# Patient Record
Sex: Male | Born: 1950 | Race: White | Hispanic: No | Marital: Married | Smoking: Former smoker
Health system: Southern US, Community
[De-identification: ages and names within clinical notes are randomized; demographics above are authoritative.]

## PROBLEM LIST (undated history)

## (undated) DIAGNOSIS — M1990 Unspecified osteoarthritis, unspecified site: Secondary | ICD-10-CM

## (undated) DIAGNOSIS — I10 Essential (primary) hypertension: Secondary | ICD-10-CM

## (undated) DIAGNOSIS — G459 Transient cerebral ischemic attack, unspecified: Secondary | ICD-10-CM

## (undated) DIAGNOSIS — H3530 Unspecified macular degeneration: Secondary | ICD-10-CM

## (undated) DIAGNOSIS — E785 Hyperlipidemia, unspecified: Secondary | ICD-10-CM

## (undated) DIAGNOSIS — I639 Cerebral infarction, unspecified: Secondary | ICD-10-CM

## (undated) DIAGNOSIS — G4730 Sleep apnea, unspecified: Secondary | ICD-10-CM

## (undated) DIAGNOSIS — K219 Gastro-esophageal reflux disease without esophagitis: Secondary | ICD-10-CM

## (undated) DIAGNOSIS — M75100 Unspecified rotator cuff tear or rupture of unspecified shoulder, not specified as traumatic: Secondary | ICD-10-CM

## (undated) HISTORY — PX: TONSILLECTOMY: SUR1361

## (undated) HISTORY — DX: Hyperlipidemia, unspecified: E78.5

## (undated) HISTORY — PX: MOHS SURGERY: SUR867

## (undated) HISTORY — PX: OTHER SURGICAL HISTORY: SHX169

## (undated) HISTORY — PX: KNEE ARTHROSCOPY: SHX127

## (undated) HISTORY — PX: COLONOSCOPY: SHX174

---

## 1968-10-27 HISTORY — PX: BACK SURGERY: SHX140

## 2006-01-03 ENCOUNTER — Emergency Department: Payer: Self-pay | Admitting: Emergency Medicine

## 2009-01-03 ENCOUNTER — Ambulatory Visit: Payer: Self-pay

## 2009-11-05 ENCOUNTER — Encounter (INDEPENDENT_AMBULATORY_CARE_PROVIDER_SITE_OTHER): Payer: Self-pay | Admitting: *Deleted

## 2009-11-12 ENCOUNTER — Ambulatory Visit: Payer: Self-pay | Admitting: Internal Medicine

## 2009-11-20 ENCOUNTER — Ambulatory Visit: Payer: Self-pay | Admitting: Internal Medicine

## 2009-11-21 ENCOUNTER — Encounter: Payer: Self-pay | Admitting: Internal Medicine

## 2010-01-18 ENCOUNTER — Ambulatory Visit: Payer: Self-pay

## 2010-02-21 ENCOUNTER — Ambulatory Visit: Payer: Self-pay | Admitting: Orthopedic Surgery

## 2010-02-27 ENCOUNTER — Ambulatory Visit: Payer: Self-pay | Admitting: Orthopedic Surgery

## 2010-03-01 ENCOUNTER — Ambulatory Visit: Payer: Self-pay | Admitting: Orthopedic Surgery

## 2010-11-28 NOTE — Procedures (Signed)
Summary: Colonoscopy  Patient: Adam Villa Note: All result statuses are Final unless otherwise noted.  Tests: (1) Colonoscopy (COL)   COL Colonoscopy           DONE     [facility]     [address]     [place], [place]  [address]           COLONOSCOPY PROCEDURE REPORT           PATIENT:  Adam, Villa  MR#:  [id number]     BIRTHDATE:  Sep 11, 1951, 58 yrs. old  GENDER:  male           ENDOSCOPIST:  Wilhemina Bonito. Eda Keys, MD     Referred by:  Horton Chin, M.D.           PROCEDURE DATE:  11/20/2009     PROCEDURE:  Colonoscopy with snare polypectomy     ASA CLASS:  Class I     INDICATIONS:  history of polyps (hyperplastic on index exam     12-2001)           MEDICATIONS:   Fentanyl 50 mcg IV, Versed 6 mg IV           DESCRIPTION OF PROCEDURE:   After the risks benefits and     alternatives of the procedure were thoroughly explained, informed     consent was obtained.  Digital rectal exam was performed and     revealed no abnormalities.   The LB CF-H180AL [id number] endoscope     was introduced through the anus and advanced to the cecum, which     was identified by both the appendix and ileocecal valve, without     limitations. Time to cecum = 2:37 min.The quality of the prep was     excellent, using MoviPrep.  The instrument was then withdrawn     (time = 13:03 min) as the colon was fully examined.     <<PROCEDUREIMAGES>>           FINDINGS:  A 3mm diminutive polyp was found in the descending     colon. Polyp was snared without cautery. Retrieval was successful.     snare polyp  This was otherwise a normal examination of the colon.     Retroflexed views in the rectum revealed internal hemorrhoids.     The scope was then withdrawn from the patient and the procedure     completed.           COMPLICATIONS:  None           ENDOSCOPIC IMPRESSION:     1) Diminutive polyp in the descending colon -removed     2) Otherwise normal examination     3) Internal hemorrhoids       RECOMMENDATIONS:     1) Repeat colonoscopy in 5 years if polyp adenomatous; otherwise     10 years           ______________________________     Wilhemina Bonito. Eda Keys, MD           CC:  Horton Chin, MD; The Patient           n.     eSIGNED:   Wilhemina Bonito. Eda Keys at 11/20/2009 08:50 AM           Ralene Bathe, [id number]  Note: An exclamation mark (!) indicates a result that was not dispersed into the flowsheet. Document Creation Date: 11/20/2009 8:51 AM _______________________________________________________________________  Marland Kitchen  1) Order result status: Final Collection or observation date-time: 11/20/2009 08:38 Requested date-time:  Receipt date-time:  Reported date-time:  Referring Physician:   Ordering Physician: Fransico Setters [id number]) Specimen Source:  Source: Launa Grill Order Number: [id number] Lab site:   Appended Document: Colonoscopy     Procedures Next Due Date:    Colonoscopy: 11/2019

## 2010-11-28 NOTE — Miscellaneous (Signed)
Summary: [facility]  Clinical Lists Changes  Medications: Added new medication of MOVIPREP 100 GM  SOLR (PEG-KCL-NACL-NASULF-NA ASC-C) As per prep instructions. - Signed Rx of MOVIPREP 100 GM  SOLR (PEG-KCL-NACL-NASULF-NA ASC-C) As per prep instructions.;  #1 x 0;  Signed;  Entered by: Ezra Sites RN;  Authorized by: Hilarie Fredrickson MD;  Method used: Electronically to [id number] [address], [place], [place]  [address], Ph: [phone] or [phone], Fax: [phone] Allergies: Added new allergy or adverse reaction of PENICILLIN Observations: Added new observation of NKA: F (11/12/2009 15:23)    Prescriptions: MOVIPREP 100 GM  SOLR (PEG-KCL-NACL-NASULF-NA ASC-C) As per prep instructions.  #1 x 0   Entered by:   Ezra Sites RN   Authorized by:   Hilarie Fredrickson MD   Signed by:   Ezra Sites RN on 11/12/2009   Method used:   Electronically to        [id number] (retail)       [address]       [place], [place]  [address]       Ph: [phone] or [phone]       Fax: [phone]   RxID:   [id number]

## 2010-11-28 NOTE — Letter (Signed)
Summary: [facility] Instructions  [facility]  [address] [place], [place] [address]   Phone: [phone]  Fax: [phone]       Adam Villa    1951/01/03    MRN: [id number]        Procedure Day /Date:  Tuesday 11/20/2009     Arrival Time:  7:30 am      Procedure Time:  8:00 am     Location of Procedure:                    _ x_  [facility] (4th Floor)   PREPARATION FOR COLONOSCOPY WITH MOVIPREP   Starting 5 days prior to your procedure Thursday 1/20 do not eat nuts, seeds, popcorn, corn, beans, peas,  salads, or any raw vegetables.  Do not take any fiber supplements (e.g. Metamucil, Citrucel, and Benefiber).  THE DAY BEFORE YOUR PROCEDURE         DATE: Monday 1/24  1.  Drink clear liquids the entire day-NO SOLID FOOD  2.  Do not drink anything colored red or purple.  Avoid juices with pulp.  No orange juice.  3.  Drink at least 64 oz. (8 glasses) of fluid/clear liquids during the day to prevent dehydration and help the prep work efficiently.  CLEAR LIQUIDS INCLUDE: Water Jello Ice Popsicles Tea (sugar ok, no milk/cream) Powdered fruit flavored drinks Coffee (sugar ok, no milk/cream) Gatorade Juice: apple, white grape, white cranberry  Lemonade Clear bullion, consomm, broth Carbonated beverages (any kind) Strained chicken noodle soup Hard Candy                             4.  In the morning, mix first dose of MoviPrep solution:    Empty 1 Pouch A and 1 Pouch B into the disposable container    Add lukewarm drinking water to the top line of the container. Mix to dissolve    Refrigerate (mixed solution should be used within 24 hrs)  5.  Begin drinking the prep at 5:00 p.m. The MoviPrep container is divided by 4 marks.   Every 15 minutes drink the solution down to the next mark (approximately 8 oz) until the full liter is complete.   6.  Follow completed prep with 16 oz of clear liquid of your choice (Nothing red or purple).   Continue to drink clear liquids until bedtime.  7.  Before going to bed, mix second dose of MoviPrep solution:    Empty 1 Pouch A and 1 Pouch B into the disposable container    Add lukewarm drinking water to the top line of the container. Mix to dissolve    Refrigerate  THE DAY OF YOUR PROCEDURE      DATE: Tuesday 1/15  Beginning at 3:00 a.m. (5 hours before procedure):         1. Every 15 minutes, drink the solution down to the next mark (approx 8 oz) until the full liter is complete.  2. Follow completed prep with 16 oz. of clear liquid of your choice.    3. You may drink clear liquids until 6:00 am (2 HOURS BEFORE PROCEDURE).   MEDICATION INSTRUCTIONS  Unless otherwise instructed, you should take regular prescription medications with a small sip of water   as early as possible the morning of your procedure.         OTHER INSTRUCTIONS  You will need a responsible adult  at least 62 years of age to accompany you and drive you home.   This person must remain in the waiting room during your procedure.  Wear loose fitting clothing that is easily removed.  Leave jewelry and other valuables at home.  However, you may wish to bring a book to read or  an iPod/MP3 player to listen to music as you wait for your procedure to start.  Remove all body piercing jewelry and leave at home.  Total time from sign-in until discharge is approximately 2-3 hours.  You should go home directly after your procedure and rest.  You can resume normal activities the  day after your procedure.  The day of your procedure you should not:   Drive   Make legal decisions   Operate machinery   Drink alcohol   Return to work  You will receive specific instructions about eating, activities and medications before you leave.    The above instructions have been reviewed and explained to me by  Ezra Sites RN  November 12, 2009 3:43 PM      I fully understand and can verbalize these  instructions _____________________________ Date _________

## 2010-11-28 NOTE — Letter (Signed)
Summary: Patient Notice- Polyp Results  [facility]  [address] [place], [place] [address]   Phone: [phone]  Fax: [phone]        November 21, 2009 MRN: [id number]    [facility] [address] [place], [place]  [address]    Dear Mr. TIPPEN,  I am pleased to inform you that the colon polyp(s) removed during your recent colonoscopy was (were) found to be benign (no cancer detected) upon pathologic examination.  I recommend you have a repeat colonoscopy examination in 10 years to look for recurrent polyps, as having colon polyps increases your risk for having recurrent polyps or even colon cancer in the future.  Should you develop new or worsening symptoms of abdominal pain, bowel habit changes or bleeding from the rectum or bowels, please schedule an evaluation with either your primary care physician or with me.  Additional information/recommendations:  __ No further action with gastroenterology is needed at this time. Please      follow-up with your primary care physician for your other healthcare      needs.  Please call us if you are having persistent problems or have questions about your condition that have not been fully answered at this time.  Sincerely,  Hilarie Fredrickson MD  This letter has been electronically signed by your physician.  Appended Document: Patient Notice- Polyp Results Letter mailed 1.28.11

## 2013-02-24 ENCOUNTER — Ambulatory Visit: Payer: Self-pay | Admitting: Internal Medicine

## 2013-07-12 ENCOUNTER — Ambulatory Visit: Payer: Self-pay | Admitting: Internal Medicine

## 2015-06-19 ENCOUNTER — Other Ambulatory Visit: Payer: Self-pay | Admitting: Orthopedic Surgery

## 2015-06-19 DIAGNOSIS — M75102 Unspecified rotator cuff tear or rupture of left shoulder, not specified as traumatic: Secondary | ICD-10-CM

## 2015-06-21 ENCOUNTER — Encounter: Payer: Self-pay | Admitting: Internal Medicine

## 2015-06-25 ENCOUNTER — Ambulatory Visit
Admission: RE | Admit: 2015-06-25 | Discharge: 2015-06-25 | Disposition: A | Discharge status: Home / Self Care | Payer: Managed Care, Other (non HMO) | Source: Ambulatory Visit | Attending: Orthopedic Surgery | Admitting: Orthopedic Surgery

## 2015-06-25 DIAGNOSIS — M19012 Primary osteoarthritis, left shoulder: Secondary | ICD-10-CM | POA: Diagnosis not present

## 2015-06-25 DIAGNOSIS — M25412 Effusion, left shoulder: Secondary | ICD-10-CM | POA: Diagnosis not present

## 2015-06-25 DIAGNOSIS — M7582 Other shoulder lesions, left shoulder: Secondary | ICD-10-CM | POA: Insufficient documentation

## 2015-06-25 DIAGNOSIS — M75102 Unspecified rotator cuff tear or rupture of left shoulder, not specified as traumatic: Secondary | ICD-10-CM

## 2015-06-25 DIAGNOSIS — M25512 Pain in left shoulder: Secondary | ICD-10-CM | POA: Diagnosis present

## 2015-08-22 ENCOUNTER — Encounter
Admission: RE | Admit: 2015-08-22 | Discharge: 2015-08-22 | Disposition: A | Discharge status: Home / Self Care | Payer: Managed Care, Other (non HMO) | Source: Ambulatory Visit | Attending: Surgery | Admitting: Surgery

## 2015-08-22 DIAGNOSIS — M75112 Incomplete rotator cuff tear or rupture of left shoulder, not specified as traumatic: Secondary | ICD-10-CM | POA: Diagnosis not present

## 2015-08-22 DIAGNOSIS — Z01818 Encounter for other preprocedural examination: Secondary | ICD-10-CM | POA: Diagnosis present

## 2015-08-22 HISTORY — DX: Essential (primary) hypertension: I10

## 2015-08-22 HISTORY — DX: Unspecified rotator cuff tear or rupture of unspecified shoulder, not specified as traumatic: M75.100

## 2015-08-22 HISTORY — DX: Hyperlipidemia, unspecified: E78.5

## 2015-08-22 HISTORY — DX: Gastro-esophageal reflux disease without esophagitis: K21.9

## 2015-08-22 HISTORY — DX: Unspecified osteoarthritis, unspecified site: M19.90

## 2015-08-22 NOTE — Patient Instructions (Addendum)
  Your procedure is scheduled on: 08/30/15 Thurs  Report to Day Surgery. To find out your arrival time please call [phone] between 1PM - 3PM on 08/29/15 Wed.  Remember: Instructions that are not followed completely may result in serious medical risk, up to and including death, or upon the discretion of your surgeon and anesthesiologist your surgery may need to be rescheduled.    __x__ 1. Do not eat food or drink liquids after midnight. No gum chewing or hard candies.     __x__ 2. No Alcohol for 24 hours before or after surgery.   ____ 3. Bring all medications with you on the day of surgery if instructed.    __x__ 4. Notify your doctor if there is any change in your medical condition     (cold, fever, infections).     Do not wear jewelry, make-up, hairpins, clips or nail polish.  Do not wear lotions, powders, or perfumes. You may wear deodorant.  Do not shave 48 hours prior to surgery. Men may shave face and neck.  Do not bring valuables to the hospital.    [facility] belongings or valuables.               Contacts, dentures or bridgework may not be worn into surgery.  Leave your suitcase in the car. After surgery it may be brought to your room.  For patients admitted to the hospital, discharge time is determined by your                treatment team.   Patients discharged the day of surgery will not be allowed to drive home.   Please read over the following fact sheets that you were given:      _x___ Take these medicines the morning of surgery with A SIP OF WATER:    1.amLODipine (NORVASC) 10 MG tablet   2. atorvastatin (LIPITOR) 40 MG tablet  3. lansoprazole (PREVACID) 30 MG capsule  4.predniSONE (DELTASONE) 1 MG tablet  5.  6.  ____ Fleet Enema (as directed)   _x___ Use CHG Soap as directed  ____ Use inhalers on the day of surgery  ____ Stop metformin 2 days prior to surgery    ____ Take 1/2 of usual insulin dose the night before  surgery and none on the morning of surgery.   __x__ Stop Coumadin/Plavix/aspirin on Stopped Aspirin 08/21/15  ____ Stop Anti-inflammatories on May take Tylenol as needed   _x___ Stop supplements until after surgery. Stop Fish oils today  __x__ Bring C-Pap to the hospital.

## 2015-08-30 ENCOUNTER — Ambulatory Visit
Admission: RE | Admit: 2015-08-30 | Discharge: 2015-08-30 | Disposition: A | Discharge status: Home / Self Care | Payer: Managed Care, Other (non HMO) | Source: Ambulatory Visit | Attending: Surgery | Admitting: Surgery

## 2015-08-30 ENCOUNTER — Encounter
Admission: RE | Disposition: A | Discharge status: Home / Self Care | Payer: Self-pay | Source: Ambulatory Visit | Attending: Surgery

## 2015-08-30 ENCOUNTER — Ambulatory Visit: Payer: Managed Care, Other (non HMO) | Admitting: Anesthesiology

## 2015-08-30 DIAGNOSIS — Z7982 Long term (current) use of aspirin: Secondary | ICD-10-CM | POA: Insufficient documentation

## 2015-08-30 DIAGNOSIS — Z79899 Other long term (current) drug therapy: Secondary | ICD-10-CM | POA: Insufficient documentation

## 2015-08-30 DIAGNOSIS — M353 Polymyalgia rheumatica: Secondary | ICD-10-CM | POA: Insufficient documentation

## 2015-08-30 DIAGNOSIS — M7522 Bicipital tendinitis, left shoulder: Secondary | ICD-10-CM | POA: Insufficient documentation

## 2015-08-30 DIAGNOSIS — Z87891 Personal history of nicotine dependence: Secondary | ICD-10-CM | POA: Insufficient documentation

## 2015-08-30 DIAGNOSIS — M170 Bilateral primary osteoarthritis of knee: Secondary | ICD-10-CM | POA: Diagnosis not present

## 2015-08-30 DIAGNOSIS — M7542 Impingement syndrome of left shoulder: Secondary | ICD-10-CM | POA: Diagnosis present

## 2015-08-30 DIAGNOSIS — I10 Essential (primary) hypertension: Secondary | ICD-10-CM | POA: Diagnosis not present

## 2015-08-30 DIAGNOSIS — M75102 Unspecified rotator cuff tear or rupture of left shoulder, not specified as traumatic: Secondary | ICD-10-CM | POA: Diagnosis not present

## 2015-08-30 DIAGNOSIS — H9191 Unspecified hearing loss, right ear: Secondary | ICD-10-CM | POA: Insufficient documentation

## 2015-08-30 DIAGNOSIS — Z880 Allergy status to penicillin: Secondary | ICD-10-CM | POA: Diagnosis not present

## 2015-08-30 DIAGNOSIS — Z8673 Personal history of transient ischemic attack (TIA), and cerebral infarction without residual deficits: Secondary | ICD-10-CM | POA: Diagnosis not present

## 2015-08-30 HISTORY — PX: SHOULDER ARTHROSCOPY WITH OPEN ROTATOR CUFF REPAIR: SHX6092

## 2015-08-30 SURGERY — ARTHROSCOPY, SHOULDER WITH REPAIR, ROTATOR CUFF, OPEN
Anesthesia: General | Site: Shoulder | Laterality: Left | Wound class: Clean

## 2015-08-30 MED ORDER — OXYCODONE HCL 5 MG/5ML PO SOLN: 5.0000 mg | Freq: Once | ORAL | Status: DC | PRN

## 2015-08-30 MED ORDER — ONDANSETRON HCL 4 MG/2ML IJ SOLN: 4.0000 mg | Freq: Four times a day (QID) | INTRAMUSCULAR | Status: DC | PRN

## 2015-08-30 MED ORDER — FAMOTIDINE 20 MG PO TABS
ORAL_TABLET | ORAL | Status: AC
  Administered 2015-08-30: 20 mg
  Filled 2015-08-30: qty 1

## 2015-08-30 MED ORDER — METOCLOPRAMIDE HCL 5 MG/ML IJ SOLN: 5.0000 mg | Freq: Three times a day (TID) | INTRAMUSCULAR | Status: DC | PRN

## 2015-08-30 MED ORDER — ROCURONIUM BROMIDE 100 MG/10ML IV SOLN
INTRAVENOUS | Status: DC | PRN
  Administered 2015-08-30: 10 mg via INTRAVENOUS
  Administered 2015-08-30: 30 mg via INTRAVENOUS

## 2015-08-30 MED ORDER — SUGAMMADEX SODIUM 200 MG/2ML IV SOLN
INTRAVENOUS | Status: DC | PRN
  Administered 2015-08-30: 200 mg via INTRAVENOUS

## 2015-08-30 MED ORDER — MIDAZOLAM HCL 5 MG/5ML IJ SOLN
INTRAMUSCULAR | Status: AC
  Administered 2015-08-30: 2 mg
  Filled 2015-08-30: qty 5

## 2015-08-30 MED ORDER — SUCCINYLCHOLINE CHLORIDE 20 MG/ML IJ SOLN
INTRAMUSCULAR | Status: DC | PRN
  Administered 2015-08-30: 100 mg via INTRAVENOUS

## 2015-08-30 MED ORDER — EPINEPHRINE HCL 1 MG/ML IJ SOLN
INTRAMUSCULAR | Status: AC
  Filled 2015-08-30: qty 2

## 2015-08-30 MED ORDER — OXYCODONE HCL 5 MG PO TABS: 5.0000 mg | ORAL_TABLET | ORAL | Status: DC | PRN

## 2015-08-30 MED ORDER — METOCLOPRAMIDE HCL 10 MG PO TABS: 5.0000 mg | ORAL_TABLET | Freq: Three times a day (TID) | ORAL | Status: DC | PRN

## 2015-08-30 MED ORDER — ONDANSETRON HCL 4 MG PO TABS: 4.0000 mg | ORAL_TABLET | Freq: Four times a day (QID) | ORAL | Status: DC | PRN

## 2015-08-30 MED ORDER — MIDAZOLAM HCL 5 MG/5ML IJ SOLN: 1.0000 mg | Freq: Once | INTRAMUSCULAR | Status: DC

## 2015-08-30 MED ORDER — CLINDAMYCIN PHOSPHATE 900 MG/50ML IV SOLN
INTRAVENOUS | Status: AC
  Filled 2015-08-30: qty 50

## 2015-08-30 MED ORDER — FENTANYL CITRATE (PF) 100 MCG/2ML IJ SOLN
INTRAMUSCULAR | Status: DC | PRN
  Administered 2015-08-30: 25 ug via INTRAVENOUS
  Administered 2015-08-30: 75 ug via INTRAVENOUS

## 2015-08-30 MED ORDER — LACTATED RINGERS IV SOLN
INTRAVENOUS | Status: DC
  Administered 2015-08-30 (×3): via INTRAVENOUS

## 2015-08-30 MED ORDER — EPHEDRINE SULFATE 50 MG/ML IJ SOLN
INTRAMUSCULAR | Status: DC | PRN
  Administered 2015-08-30: 10 mg via INTRAVENOUS

## 2015-08-30 MED ORDER — CLINDAMYCIN PHOSPHATE 900 MG/50ML IV SOLN
900.0000 mg | Freq: Once | INTRAVENOUS | Status: AC
  Administered 2015-08-30: 900 mg via INTRAVENOUS

## 2015-08-30 MED ORDER — BUPIVACAINE-EPINEPHRINE 0.5% -1:200000 IJ SOLN
INTRAMUSCULAR | Status: DC | PRN
  Administered 2015-08-30: 30 mL

## 2015-08-30 MED ORDER — PHENYLEPHRINE HCL 10 MG/ML IJ SOLN
INTRAMUSCULAR | Status: DC | PRN
  Administered 2015-08-30 (×3): 100 ug via INTRAVENOUS

## 2015-08-30 MED ORDER — BUPIVACAINE-EPINEPHRINE (PF) 0.5% -1:200000 IJ SOLN
INTRAMUSCULAR | Status: AC
  Filled 2015-08-30: qty 30

## 2015-08-30 MED ORDER — PROPOFOL 10 MG/ML IV BOLUS
INTRAVENOUS | Status: DC | PRN
  Administered 2015-08-30: 160 mg via INTRAVENOUS

## 2015-08-30 MED ORDER — MIDAZOLAM HCL 5 MG/ML IJ SOLN: 1.0000 mg | Freq: Once | INTRAMUSCULAR | Status: DC

## 2015-08-30 MED ORDER — OXYCODONE HCL 5 MG PO TABS: 5.0000 mg | ORAL_TABLET | Freq: Once | ORAL | Status: DC | PRN

## 2015-08-30 MED ORDER — POTASSIUM CHLORIDE IN NACL 20-0.9 MEQ/L-% IV SOLN: INTRAVENOUS | Status: DC

## 2015-08-30 MED ORDER — EPINEPHRINE HCL 1 MG/ML IJ SOLN
INTRAMUSCULAR | Status: DC | PRN
Start: 2015-08-30 — End: 2015-08-30
  Administered 2015-08-30: 2 mL

## 2015-08-30 MED ORDER — ONDANSETRON HCL 4 MG/2ML IJ SOLN
INTRAMUSCULAR | Status: DC | PRN
  Administered 2015-08-30: 4 mg via INTRAVENOUS

## 2015-08-30 MED ORDER — LIDOCAINE HCL (PF) 1 % IJ SOLN
INTRAMUSCULAR | Status: AC
  Administered 2015-08-30: 100 mL
  Administered 2015-08-30: 1 mL via INTRADERMAL
  Filled 2015-08-30: qty 5

## 2015-08-30 MED ORDER — ROPIVACAINE HCL 5 MG/ML IJ SOLN
INTRAMUSCULAR | Status: AC
  Administered 2015-08-30: 10 mL via PERINEURAL
  Administered 2015-08-30 (×2): 5 mL via PERINEURAL
  Filled 2015-08-30: qty 20

## 2015-08-30 MED ORDER — FENTANYL CITRATE (PF) 100 MCG/2ML IJ SOLN: 25.0000 ug | INTRAMUSCULAR | Status: DC | PRN

## 2015-08-30 SURGICAL SUPPLY — 47 items
ANCHOR JUGGERKNOT WTAP NDL 2.9 (Anchor) ×6 IMPLANT
BIT DRILL JUGRKNT W/NDL BIT2.9 (DRILL) ×1 IMPLANT
BLADE FULL RADIUS 3.5 (BLADE) ×3 IMPLANT
BLADE SHAVER 4.5X7 STR FR (MISCELLANEOUS) ×3 IMPLANT
BUR ACROMIONIZER 4.0 (BURR) ×3 IMPLANT
BUR BR 5.5 WIDE MOUTH (BURR) ×3 IMPLANT
CANNULA 8.5X75 THRED (CANNULA) IMPLANT
CANNULA SHAVER 8MMX76MM (CANNULA) ×3 IMPLANT
CHLORAPREP W/TINT 26ML (MISCELLANEOUS) ×3 IMPLANT
COVER MAYO STAND STRL (DRAPES) ×3 IMPLANT
DRAPE IMP U-DRAPE 54X76 (DRAPES) ×3 IMPLANT
DRAPE SURG 17X11 SM STRL (DRAPES) ×3 IMPLANT
DRILL JUGGERKNOT W/NDL BIT 2.9 (DRILL) ×3
DRSG OPSITE POSTOP 4X8 (GAUZE/BANDAGES/DRESSINGS) ×3 IMPLANT
GAUZE PETRO XEROFOAM 1X8 (MISCELLANEOUS) ×3 IMPLANT
GAUZE SPONGE 4X4 12PLY STRL (GAUZE/BANDAGES/DRESSINGS) ×3 IMPLANT
GLOVE BIO SURGEON STRL SZ7.5 (GLOVE) ×6 IMPLANT
GLOVE BIO SURGEON STRL SZ8 (GLOVE) ×6 IMPLANT
GLOVE BIOGEL PI IND STRL 8 (GLOVE) ×1 IMPLANT
GLOVE BIOGEL PI INDICATOR 8 (GLOVE) ×2
GLOVE INDICATOR 8.0 STRL GRN (GLOVE) ×3 IMPLANT
GOWN STRL REUS W/ TWL LRG LVL3 (GOWN DISPOSABLE) ×2 IMPLANT
GOWN STRL REUS W/ TWL XL LVL3 (GOWN DISPOSABLE) IMPLANT
GOWN STRL REUS W/TWL LRG LVL3 (GOWN DISPOSABLE) ×4
GOWN STRL REUS W/TWL XL LVL3 (GOWN DISPOSABLE)
GRASPER SUT 15 45D LOW PRO (SUTURE) IMPLANT
IV LACTATED RINGER IRRG 3000ML (IV SOLUTION) ×4
IV LR IRRIG 3000ML ARTHROMATIC (IV SOLUTION) ×2 IMPLANT
MANIFOLD NEPTUNE II (INSTRUMENTS) ×3 IMPLANT
MASK FACE SPIDER DISP (MASK) ×3 IMPLANT
MAT BLUE FLOOR 46X72 FLO (MISCELLANEOUS) ×3 IMPLANT
NEEDLE REVERSE CUT 1/2 CRC (NEEDLE) IMPLANT
PACK ARTHROSCOPY SHOULDER (MISCELLANEOUS) ×3 IMPLANT
PAD GROUND ADULT SPLIT (MISCELLANEOUS) ×3 IMPLANT
SLING ARM LRG DEEP (SOFTGOODS) ×3 IMPLANT
SLING ULTRA II LG (MISCELLANEOUS) IMPLANT
STAPLER SKIN PROX 35W (STAPLE) ×3 IMPLANT
STRAP SAFETY BODY (MISCELLANEOUS) ×6 IMPLANT
SUT ETHIBOND 0 MO6 C/R (SUTURE) ×3 IMPLANT
SUT PROLENE 4 0 PS 2 18 (SUTURE) IMPLANT
SUT VIC AB 2-0 CT1 27 (SUTURE) ×4
SUT VIC AB 2-0 CT1 TAPERPNT 27 (SUTURE) ×2 IMPLANT
TAPE MICROFOAM 4IN (TAPE) ×3 IMPLANT
TUBING ARTHRO INFLOW-ONLY STRL (TUBING) ×3 IMPLANT
TUBING CONNECTING 10 (TUBING) ×2 IMPLANT
TUBING CONNECTING 10' (TUBING) ×1
WAND HAND CNTRL MULTIVAC 90 (MISCELLANEOUS) ×3 IMPLANT

## 2015-08-30 NOTE — H&P (Signed)
Paper H&P to be scanned into permanent record. H&P reviewed. No changes. 

## 2015-08-30 NOTE — Anesthesia Postprocedure Evaluation (Signed)
  Anesthesia Post-op Note  Patient: Adam Villa  Procedure(s) Performed: Procedure(s): SHOULDER ARTHROSCOPY WITH OPEN ROTATOR CUFF REPAIR and decompression, debridement , biceps tenodesis (Left)  Anesthesia type:General ETT  Patient location: PACU  Post pain: Pain level controlled  Post assessment: Post-op Vital signs reviewed, Patient's Cardiovascular Status Stable, Respiratory Function Stable, Patent Airway and No signs of Nausea or vomiting  Post vital signs: Reviewed and stable  Last Vitals:  Filed Vitals:   08/30/15 1356  BP: 132/74  Pulse: 80  Temp:   Resp: 16    Level of consciousness: awake, alert  and patient cooperative  Complications: No apparent anesthesia complications

## 2015-08-30 NOTE — Op Note (Signed)
08/30/2015  11:57 AM  Patient:   Adam Villa  Pre-Op Diagnosis:   Impingement/tendinopathy with partial-thickness rotator cuff tear, left shoulder.  Postoperative diagnosis: Impingement/tendinopathy with near full-thickness rotator cuff tear and biceps tendinopathy, left shoulder.  Procedure: Extensive arthroscopic debridement, arthroscopic subacromial decompression, mini-open rotator cuff repair, and mini-open biceps tenodesis, left shoulder.  Anesthesia: General endotracheal with interscalene block placed preoperatively by the anesthesiologist.  Surgeon:   Maryagnes Amos, MD  Assistant:   None  Findings: As above. There was moderate labral fraying anteriorly and superiorly without frank detachment of the labrum from the glenoid. The articular surfaces of both the glenoid and humerus were in satisfactory condition. There was extensive thinning with tendinopathic changes of the anterior insertional fibers of the supraspinatus tendon. The remaining portions of the rotator cuff were in satisfactory condition. There also were significant tendinopathic changes with partial tearing of the long head of the biceps tendon.  Complications: None  Fluids:   1000 cc  Estimated blood loss: 5 cc  Tourniquet time: None  Drains: None  Closure: Staples   Brief clinical note: The patient is a 64 year old male with a history of progressively worsening right shoulder pain. The patient's symptoms have progressed despite medications, activity modification, etc. The patient's history and examination are consistent with impingement/tendinopathy with a rotator cuff tear. These findings were confirmed by MRI scan. The patient presents at this time for definitive management of these shoulder symptoms.  Procedure: The patient underwent placement of an interscalene block by the anesthesiologist in the preoperative holding area before he was brought into the operating room and lain in  the supine position. After adequate general endotracheal intubation and anesthesia was obtained, the patient was repositioned in the beach chair position using the beach chair positioner. The left shoulder and upper extremity were prepped with ChloraPrep solution before being draped sterilely. Preoperative antibiotics were administered. A timeout was performed to confirm the proper surgical site before the expected portal sites and incision site were injected with 0.5% Sensorcaine with epinephrine. A posterior portal was created and the glenohumeral joint thoroughly inspected with the findings as described above. An anterior portal was created using an outside-in technique. The labrum and rotator cuff were further probed, again confirming the above-noted findings. The full radius resector was introduced and used to debride the frayed portions of the anterior and superior portions of the labrum. The shaver also was used to debride the anterior insertional fibers of the super spinatus tendon, as well as the extensive synovitis noted anteriorly, superiorly, and posteriorly. The ArthroCare wand was inserted and used to release the biceps tendon from its labral attachment. The ArthroCare wand also was used to obtain hemostasis as well as to "anneal" the labrum superiorly and anteriorly. The instruments were removed from the joint after suctioning the excess fluid.  The camera was repositioned through the posterior portal into the subacromial space. A separate lateral portal was created using an outside-in technique. The 3.5 mm full-radius resector was introduced and used to perform a subtotal bursectomy. The ArthroCare wand was then inserted and used to remove the periosteal tissue off the undersurface of the anterior third of the acromion as well as to recess the coracoacromial ligament from its attachment along the anterior and lateral margins of the acromion. The 4.0 mm acromionizing bur was introduced and used to  complete the decompression by removing the undersurface of the anterior third of the acromion. The full radius resector was reintroduced to remove any residual bony  debris before the ArthroCare wand was reintroduced to obtain hemostasis. The instruments were then removed from the subacromial space after suctioning the excess fluid.  An approximately 4-5 cm incision was made over the anterolateral aspect of the shoulder beginning at the anterolateral corner of the acromion and extending distally in line with the bicipital groove. This incision was carried down through the subcutaneous tissues to expose the deltoid fascia. The raphae between the anterior and middle thirds was identified and this plane developed to provide access into the subacromial space. Additional bursal tissues were debrided sharply using Metzenbaum scissors. The thinned portion of the rotator cuff tear involving the anterior insertional fibers of the supraspinatus tendon was readily identified. The thinned out and degenerative torn margins of the supraspinatus tendon were debrided sharply with a #15 blade and the exposed greater tuberosity roughened with a rongeur. The tear was repaired using a single Biomet 2.9 mm JuggerKnot anchor. Several #0 Ethibond sutures were used to close the defect in a side-to-side fashion.. An apparent watertight closure was obtained.  The bicipital groove was identified by palpation and opened for 1-1.5 cm. The biceps tendon stump was retrieved through this defect. The floor of the bicipital groove was roughened with a curet before another Biomet 2.9 mm JuggerKnot anchor was inserted. Both sets of sutures were passed through the biceps tendon and tied securely to effect the tenodesis. The bicipital sheath was reapproximated using two #0 Ethibond interrupted sutures, incorporating the biceps tendon to further reinforce the tenodesis.  The wound was copiously irrigated with sterile saline solution before the  deltoid raphae was reapproximated using 2-0 Vicryl interrupted sutures. The subcutaneous tissues were closed in two layers using 2-0 Vicryl interrupted sutures before the skin was closed using staples. The portal sites also were closed using staples. A sterile bulky dressing was applied to the shoulder before the arm was placed into a shoulder immobilizer. The patient was then awakened, extubated, and returned to the recovery room in satisfactory condition after tolerating the procedure well.

## 2015-08-30 NOTE — Transfer of Care (Signed)
Immediate Anesthesia Transfer of Care Note  Patient: Adam Villa  Procedure(s) Performed: Procedure(s): SHOULDER ARTHROSCOPY WITH OPEN ROTATOR CUFF REPAIR and decompression, debridement , biceps tenodesis (Left)  Patient Location: PACU  Anesthesia Type:General  Level of Consciousness: sedated  Airway & Oxygen Therapy: Patient Spontanous Breathing and Patient connected to face mask oxygen  Post-op Assessment: Report given to RN and Post -op Vital signs reviewed and stable  Post vital signs: Reviewed and stable  Last Vitals:  Filed Vitals:   08/30/15 1155  BP:   Pulse: 92  Temp: 36.8 C  Resp: 16    Complications: No apparent anesthesia complications

## 2015-08-30 NOTE — Discharge Instructions (Addendum)
Keep dressing dry and intact.  °May shower after dressing changed on post-op day #4 (Monday).  °Cover staples with Band-Aids after drying off. °Apply ice frequently to shoulder. °Keep shoulder immobilizer on at all times except may remove for bathing purposes. °Follow-up in 10-14 days or as scheduled.AMBULATORY SURGERY  °DISCHARGE INSTRUCTIONS ° ° °1) The drugs that you were given will stay in your system until tomorrow so for the next 24 hours you should not: ° °A) Drive an automobile °B) Make any legal decisions °C) Drink any alcoholic beverage ° ° °2) You may resume regular meals tomorrow.  Today it is better to start with liquids and gradually work up to solid foods. ° °You may eat anything you prefer, but it is better to start with liquids, then soup and crackers, and gradually work up to solid foods. ° ° °3) Please notify your doctor immediately if you have any unusual bleeding, trouble breathing, redness and pain at the surgery site, drainage, fever, or pain not relieved by medication. ° ° ° °4) Additional Instructions: ° ° ° ° ° ° ° °Please contact your physician with any problems or Same Day Surgery at [phone], Monday through Friday 6 am to 4 pm, or [place] [facility] [place] Main number at [phone]. °

## 2015-08-30 NOTE — Anesthesia Procedure Notes (Addendum)
Anesthesia Regional Block:  Interscalene brachial plexus block  Pre-Anesthetic Checklist: ,, timeout performed, Correct Patient, Correct Site, Correct Laterality, Correct Procedure, Correct Position, site marked, Risks and benefits discussed,  Surgical consent,  Pre-op evaluation,  At surgeon's request and post-op pain management  Laterality: Upper and Left  Prep: chloraprep       Needles:  Injection technique: Single-shot  Needle Type: Stimiplex     Needle Length: 5cm 5 cm Needle Gauge: 22 and 22 G    Additional Needles:  Procedures: ultrasound guided (picture in chart) Interscalene brachial plexus block  Nerve Stimulator or Paresthesia:  Response: biceps flexion,   Additional Responses:   Narrative:  Start time: 08/30/2015 8:48 AM End time: 08/30/2015 8:50 AM Injection made incrementally with aspirations every 5 mL.  Performed by: Personally  Anesthesiologist: Margorie JohnPISCITELLO, JOSEPH K  Additional Notes: Functioning IV was confirmed and monitors were applied.  A 50mm 22ga Stimuplex needle was used. Sterile prep and drape,hand hygiene and sterile gloves were used.  Negative aspiration and negative test dose prior to incremental administration of local anesthetic. The patient tolerated the procedure well with no immediate complications.      Procedure Name: Intubation Date/Time: 08/30/2015 10:15 AM Performed by: Henrietta HooverPOPE, [place] Scheiderer Pre-anesthesia Checklist: Patient identified, Suction available, Emergency Drugs available, Patient being monitored and Timeout performed Patient Re-evaluated:Patient Re-evaluated prior to inductionOxygen Delivery Method: Circle system utilized Preoxygenation: Pre-oxygenation with 100% oxygen Intubation Type: IV induction Ventilation: Mask ventilation without difficulty Laryngoscope Size: Mac and 3 Grade View: Grade II Tube type: Oral Tube size: 7.5 mm Number of attempts: 1 Placement Confirmation: ETT inserted through vocal cords under direct  vision,  positive ETCO2,  CO2 detector and breath sounds checked- equal and bilateral Secured at: 22 cm Tube secured with: Tape

## 2015-08-30 NOTE — Anesthesia Preprocedure Evaluation (Addendum)
Anesthesia Evaluation  Patient identified by MRN, date of birth, ID band Patient awake    Reviewed: Allergy & Precautions, H&P , NPO status , Patient's Chart, lab work & pertinent test results  History of Anesthesia Complications Negative for: history of anesthetic complications  Airway Mallampati: III  TM Distance: <3 FB Neck ROM: limited    Dental no notable dental hx. (+) Teeth Intact   Pulmonary neg shortness of breath, former smoker,    Pulmonary exam normal breath sounds clear to auscultation       Cardiovascular Exercise Tolerance: Good hypertension, (-) angina(-) Past MI and (-) DOE Normal cardiovascular exam Rhythm:regular Rate:Normal     Neuro/Psych TIAnegative psych ROS   GI/Hepatic Neg liver ROS, GERD  Controlled and Medicated,  Endo/Other  negative endocrine ROS  Renal/GU negative Renal ROS  negative genitourinary   Musculoskeletal  (+) Arthritis ,   Abdominal   Peds  Hematology negative hematology ROS (+)   Anesthesia Other Findings Past Medical History:   Arthritis                                                    Elevated lipids                                              Torn rotator cuff                                              Comment:left   Hypertension                                                 GERD (gastroesophageal reflux disease)                      Past Surgical History:   BACK SURGERY                                                    Comment:lower   TONSILLECTOMY                                                 cyst from back                                                KNEE ARTHROSCOPY                                Left  BMI    Body Mass Index   27.83 kg/m 2    Patient has pre existing weakness in left sholder  Reproductive/Obstetrics negative OB ROS                            Anesthesia Physical Anesthesia Plan  ASA:  III  Anesthesia Plan: General ETT   Post-op Pain Management: GA combined w/ Regional for post-op pain   Induction:   Airway Management Planned:   Additional Equipment:   Intra-op Plan:   Post-operative Plan:   Informed Consent: I have reviewed the patients History and Physical, chart, labs and discussed the procedure including the risks, benefits and alternatives for the proposed anesthesia with the patient or authorized representative who has indicated his/her understanding and acceptance.   Dental Advisory Given  Plan Discussed with: Anesthesiologist, CRNA and Surgeon  Anesthesia Plan Comments:         Anesthesia Quick Evaluation

## 2015-09-04 ENCOUNTER — Other Ambulatory Visit: Payer: Self-pay | Admitting: Orthopedic Surgery

## 2015-09-04 DIAGNOSIS — M25461 Effusion, right knee: Secondary | ICD-10-CM

## 2015-09-12 ENCOUNTER — Ambulatory Visit
Admission: RE | Admit: 2015-09-12 | Discharge: 2015-09-12 | Disposition: A | Discharge status: Home / Self Care | Payer: Managed Care, Other (non HMO) | Source: Ambulatory Visit | Attending: Orthopedic Surgery | Admitting: Orthopedic Surgery

## 2015-09-12 DIAGNOSIS — M1711 Unilateral primary osteoarthritis, right knee: Secondary | ICD-10-CM | POA: Insufficient documentation

## 2015-09-12 DIAGNOSIS — M25461 Effusion, right knee: Secondary | ICD-10-CM | POA: Diagnosis present

## 2015-09-12 DIAGNOSIS — S83231A Complex tear of medial meniscus, current injury, right knee, initial encounter: Secondary | ICD-10-CM | POA: Insufficient documentation

## 2015-10-09 ENCOUNTER — Encounter: Payer: Self-pay | Admitting: *Deleted

## 2015-10-09 ENCOUNTER — Other Ambulatory Visit: Payer: Managed Care, Other (non HMO)

## 2015-10-09 NOTE — Patient Instructions (Signed)
  Your procedure is scheduled on: 10-16-15 Report to [facility] 2ND FLOOR To find out your arrival time please call [phone] between 1PM - 3PM on 10-15-15  Remember: Instructions that are not followed completely may result in serious medical risk, up to and including death, or upon the discretion of your surgeon and anesthesiologist your surgery may need to be rescheduled.    _X___ 1. Do not eat food or drink liquids after midnight. No gum chewing or hard candies.     _X___ 2. No Alcohol for 24 hours before or after surgery.   ____ 3. Bring all medications with you on the day of surgery if instructed.    ____ 4. Notify your doctor if there is any change in your medical condition     (cold, fever, infections).     Do not wear jewelry, make-up, hairpins, clips or nail polish.  Do not wear lotions, powders, or perfumes. You may wear deodorant.  Do not shave 48 hours prior to surgery. Men may shave face and neck.  Do not bring valuables to the hospital.    [facility] is [facility] for any belongings or valuables.               Contacts, dentures or bridgework may not be worn into surgery.  Leave your suitcase in the car. After surgery it may be brought to your room.  For patients admitted to the hospital, discharge time is determined by your treatment team.   Patients discharged the day of surgery will not be allowed to drive home.   Please read over the following fact sheets that you were given:     __X__ Take these medicines the morning of surgery with A SIP OF WATER:    1. LIPITOR  2. PREVACID   3. TAKE AN EXTRA PREVACID Monday NIGHT  4.  5.  6.  ____ Fleet Enema (as directed)   ____ Use CHG Soap as directed  ____ Use inhalers on the day of surgery  ____ Stop metformin 2 days prior to surgery    ____ Take 1/2 of usual insulin dose the night before surgery and none on the morning of surgery.   _X___ Stop Coumadin/Plavix/aspirin-PT STOPPED  ASA ON 10-06-15 PER PT  ____ Stop Anti-inflammatories-NO NSAIDS OR ASA PRODUCTS-TYLENOL OK   _X___ Stop supplements until after surgery-PT STOPPED FISH OIL ON 10-06-15  ____ Bring C-Pap to the hospital.

## 2015-10-16 MED ORDER — BUPIVACAINE-EPINEPHRINE (PF) 0.5% -1:200000 IJ SOLN
INTRAMUSCULAR | Status: AC
  Filled 2015-10-16: qty 30

## 2015-10-23 ENCOUNTER — Ambulatory Visit
Admission: RE | Admit: 2015-10-23 | Payer: Managed Care, Other (non HMO) | Source: Ambulatory Visit | Admitting: Orthopedic Surgery

## 2015-10-23 ENCOUNTER — Ambulatory Visit
Admission: RE | Admit: 2015-10-23 | Discharge: 2015-10-23 | Disposition: A | Discharge status: Home / Self Care | Payer: Managed Care, Other (non HMO) | Source: Ambulatory Visit | Attending: Orthopedic Surgery | Admitting: Orthopedic Surgery

## 2015-10-23 ENCOUNTER — Encounter: Payer: Self-pay | Admitting: *Deleted

## 2015-10-23 ENCOUNTER — Encounter
Admission: RE | Disposition: A | Discharge status: Home / Self Care | Payer: Self-pay | Source: Ambulatory Visit | Attending: Orthopedic Surgery

## 2015-10-23 ENCOUNTER — Ambulatory Visit: Payer: Managed Care, Other (non HMO) | Admitting: Anesthesiology

## 2015-10-23 DIAGNOSIS — Z8673 Personal history of transient ischemic attack (TIA), and cerebral infarction without residual deficits: Secondary | ICD-10-CM | POA: Insufficient documentation

## 2015-10-23 DIAGNOSIS — Z7982 Long term (current) use of aspirin: Secondary | ICD-10-CM | POA: Diagnosis not present

## 2015-10-23 DIAGNOSIS — Z79899 Other long term (current) drug therapy: Secondary | ICD-10-CM | POA: Insufficient documentation

## 2015-10-23 DIAGNOSIS — M170 Bilateral primary osteoarthritis of knee: Secondary | ICD-10-CM | POA: Diagnosis not present

## 2015-10-23 DIAGNOSIS — M23221 Derangement of posterior horn of medial meniscus due to old tear or injury, right knee: Secondary | ICD-10-CM | POA: Diagnosis not present

## 2015-10-23 DIAGNOSIS — H9190 Unspecified hearing loss, unspecified ear: Secondary | ICD-10-CM | POA: Diagnosis not present

## 2015-10-23 DIAGNOSIS — I10 Essential (primary) hypertension: Secondary | ICD-10-CM | POA: Insufficient documentation

## 2015-10-23 DIAGNOSIS — M659 Synovitis and tenosynovitis, unspecified: Secondary | ICD-10-CM | POA: Diagnosis not present

## 2015-10-23 DIAGNOSIS — Z8261 Family history of arthritis: Secondary | ICD-10-CM | POA: Insufficient documentation

## 2015-10-23 DIAGNOSIS — Z807 Family history of other malignant neoplasms of lymphoid, hematopoietic and related tissues: Secondary | ICD-10-CM | POA: Diagnosis not present

## 2015-10-23 DIAGNOSIS — R739 Hyperglycemia, unspecified: Secondary | ICD-10-CM | POA: Insufficient documentation

## 2015-10-23 DIAGNOSIS — Z87891 Personal history of nicotine dependence: Secondary | ICD-10-CM | POA: Insufficient documentation

## 2015-10-23 DIAGNOSIS — Z880 Allergy status to penicillin: Secondary | ICD-10-CM | POA: Diagnosis not present

## 2015-10-23 DIAGNOSIS — Z833 Family history of diabetes mellitus: Secondary | ICD-10-CM | POA: Diagnosis not present

## 2015-10-23 DIAGNOSIS — M353 Polymyalgia rheumatica: Secondary | ICD-10-CM | POA: Insufficient documentation

## 2015-10-23 HISTORY — PX: KNEE ARTHROSCOPY: SHX127

## 2015-10-23 HISTORY — DX: Transient cerebral ischemic attack, unspecified: G45.9

## 2015-10-23 HISTORY — DX: Cerebral infarction, unspecified: I63.9

## 2015-10-23 SURGERY — ARTHROSCOPY, KNEE
Anesthesia: General | Site: Knee | Laterality: Right | Wound class: Clean

## 2015-10-23 SURGERY — ARTHROSCOPY, KNEE
Anesthesia: Choice | Laterality: Right

## 2015-10-23 MED ORDER — FAMOTIDINE 20 MG PO TABS
20.0000 mg | ORAL_TABLET | Freq: Once | ORAL | Status: AC
  Administered 2015-10-23: 20 mg via ORAL

## 2015-10-23 MED ORDER — PROPOFOL 10 MG/ML IV BOLUS
INTRAVENOUS | Status: DC | PRN
  Administered 2015-10-23: 160 mg via INTRAVENOUS
  Administered 2015-10-23: 40 mg via INTRAVENOUS

## 2015-10-23 MED ORDER — FENTANYL CITRATE (PF) 100 MCG/2ML IJ SOLN
INTRAMUSCULAR | Status: AC
  Administered 2015-10-23: 25 ug via INTRAVENOUS
  Filled 2015-10-23: qty 2

## 2015-10-23 MED ORDER — HYDROCODONE-ACETAMINOPHEN 5-325 MG PO TABS: 1.0000 | ORAL_TABLET | ORAL | Status: DC | PRN

## 2015-10-23 MED ORDER — FENTANYL CITRATE (PF) 100 MCG/2ML IJ SOLN
25.0000 ug | INTRAMUSCULAR | Status: AC | PRN
  Administered 2015-10-23 (×6): 25 ug via INTRAVENOUS

## 2015-10-23 MED ORDER — ONDANSETRON HCL 4 MG/2ML IJ SOLN
INTRAMUSCULAR | Status: DC | PRN
  Administered 2015-10-23: 4 mg via INTRAVENOUS

## 2015-10-23 MED ORDER — DEXAMETHASONE SODIUM PHOSPHATE 10 MG/ML IJ SOLN
INTRAMUSCULAR | Status: DC | PRN
  Administered 2015-10-23: 4 mg via INTRAVENOUS

## 2015-10-23 MED ORDER — BUPIVACAINE-EPINEPHRINE (PF) 0.5% -1:200000 IJ SOLN
INTRAMUSCULAR | Status: DC | PRN
  Administered 2015-10-23: 30 mL

## 2015-10-23 MED ORDER — MIDAZOLAM HCL 2 MG/2ML IJ SOLN
INTRAMUSCULAR | Status: DC | PRN
  Administered 2015-10-23: 2 mg via INTRAVENOUS

## 2015-10-23 MED ORDER — HYDROCODONE-ACETAMINOPHEN 5-325 MG PO TABS: 1.0000 | ORAL_TABLET | Freq: Four times a day (QID) | ORAL | Status: DC | PRN

## 2015-10-23 MED ORDER — KETOROLAC TROMETHAMINE 30 MG/ML IJ SOLN
INTRAMUSCULAR | Status: DC | PRN
  Administered 2015-10-23: 15 mg via INTRAVENOUS

## 2015-10-23 MED ORDER — METOCLOPRAMIDE HCL 5 MG/ML IJ SOLN: 5.0000 mg | Freq: Three times a day (TID) | INTRAMUSCULAR | Status: DC | PRN

## 2015-10-23 MED ORDER — METOCLOPRAMIDE HCL 10 MG PO TABS: 5.0000 mg | ORAL_TABLET | Freq: Three times a day (TID) | ORAL | Status: DC | PRN

## 2015-10-23 MED ORDER — ONDANSETRON HCL 4 MG/2ML IJ SOLN: 4.0000 mg | Freq: Once | INTRAMUSCULAR | Status: DC | PRN

## 2015-10-23 MED ORDER — BUPIVACAINE-EPINEPHRINE (PF) 0.5% -1:200000 IJ SOLN
INTRAMUSCULAR | Status: AC
  Filled 2015-10-23: qty 30

## 2015-10-23 MED ORDER — LIDOCAINE HCL (CARDIAC) 20 MG/ML IV SOLN
INTRAVENOUS | Status: DC | PRN
  Administered 2015-10-23: 80 mg via INTRAVENOUS

## 2015-10-23 MED ORDER — LACTATED RINGERS IV SOLN
INTRAVENOUS | Status: DC
  Administered 2015-10-23: 11:00:00 via INTRAVENOUS

## 2015-10-23 MED ORDER — FENTANYL CITRATE (PF) 100 MCG/2ML IJ SOLN
INTRAMUSCULAR | Status: DC | PRN
  Administered 2015-10-23 (×2): 50 ug via INTRAVENOUS

## 2015-10-23 MED ORDER — ONDANSETRON HCL 4 MG/2ML IJ SOLN: 4.0000 mg | Freq: Four times a day (QID) | INTRAMUSCULAR | Status: DC | PRN

## 2015-10-23 MED ORDER — SODIUM CHLORIDE 0.9 % IV SOLN: INTRAVENOUS | Status: DC

## 2015-10-23 MED ORDER — GLYCOPYRROLATE 0.2 MG/ML IJ SOLN
INTRAMUSCULAR | Status: DC | PRN
  Administered 2015-10-23: 0.2 mg via INTRAVENOUS

## 2015-10-23 MED ORDER — ONDANSETRON HCL 4 MG PO TABS: 4.0000 mg | ORAL_TABLET | Freq: Four times a day (QID) | ORAL | Status: DC | PRN

## 2015-10-23 SURGICAL SUPPLY — 28 items
BANDAGE ACE 4X5 VEL STRL LF (GAUZE/BANDAGES/DRESSINGS) ×3 IMPLANT
BANDAGE ELASTIC 4 LF NS (GAUZE/BANDAGES/DRESSINGS) ×3 IMPLANT
BLADE FULL RADIUS 3.5 (BLADE) IMPLANT
BLADE INCISOR PLUS 4.5 (BLADE) IMPLANT
BLADE SHAVER 4.5 DBL SERAT CV (CUTTER) IMPLANT
BLADE SHAVER 4.5X7 STR FR (MISCELLANEOUS) ×3 IMPLANT
CHLORAPREP W/TINT 26ML (MISCELLANEOUS) ×3 IMPLANT
CUTTER AGGRESSIVE+ 3.5 (CUTTER) IMPLANT
GAUZE PETRO XEROFOAM 1X8 (MISCELLANEOUS) ×3 IMPLANT
GAUZE SPONGE 4X4 12PLY STRL (GAUZE/BANDAGES/DRESSINGS) ×3 IMPLANT
GLOVE BIOGEL PI IND STRL 9 (GLOVE) ×1 IMPLANT
GLOVE BIOGEL PI INDICATOR 9 (GLOVE) ×2
GLOVE SURG ORTHO 9.0 STRL STRW (GLOVE) ×3 IMPLANT
GOWN SPECIALTY ULTRA XL (MISCELLANEOUS) ×3 IMPLANT
GOWN STRL REUS W/ TWL LRG LVL3 (GOWN DISPOSABLE) ×1 IMPLANT
GOWN STRL REUS W/TWL LRG LVL3 (GOWN DISPOSABLE) ×2
IV LACTATED RINGER IRRG 3000ML (IV SOLUTION) ×8
IV LR IRRIG 3000ML ARTHROMATIC (IV SOLUTION) ×4 IMPLANT
KIT RM TURNOVER STRD PROC AR (KITS) ×3 IMPLANT
MANIFOLD NEPTUNE II (INSTRUMENTS) ×3 IMPLANT
PACK ARTHROSCOPY KNEE (MISCELLANEOUS) ×3 IMPLANT
SET TUBE SUCT SHAVER OUTFL 24K (TUBING) ×3 IMPLANT
SET TUBE TIP INTRA-ARTICULAR (MISCELLANEOUS) ×3 IMPLANT
SUT ETHILON 4-0 (SUTURE) ×2
SUT ETHILON 4-0 FS2 18XMFL BLK (SUTURE) ×1
SUTURE ETHLN 4-0 FS2 18XMF BLK (SUTURE) ×1 IMPLANT
TUBING ARTHRO INFLOW-ONLY STRL (TUBING) ×3 IMPLANT
WAND HAND CNTRL MULTIVAC 50 (MISCELLANEOUS) ×3 IMPLANT

## 2015-10-23 NOTE — Anesthesia Preprocedure Evaluation (Signed)
Anesthesia Evaluation  Patient identified by MRN, date of birth, ID band Patient awake    Reviewed: Allergy & Precautions, NPO status , Patient's Chart, lab work & pertinent test results  Airway Mallampati: II       Dental no notable dental hx.    Pulmonary neg pulmonary ROS, former smoker,    breath sounds clear to auscultation       Cardiovascular Exercise Tolerance: Good hypertension, Pt. on medications  Rhythm:Regular     Neuro/Psych    GI/Hepatic Neg liver ROS, GERD  ,  Endo/Other  negative endocrine ROS  Renal/GU negative Renal ROS     Musculoskeletal   Abdominal Normal abdominal exam  (+)   Peds  Hematology negative hematology ROS (+)   Anesthesia Other Findings   Reproductive/Obstetrics                             Anesthesia Physical Anesthesia Plan  ASA: II  Anesthesia Plan: General   Post-op Pain Management:    Induction: Intravenous  Airway Management Planned: LMA  Additional Equipment:   Intra-op Plan:   Post-operative Plan: Extubation in OR  Informed Consent: I have reviewed the patients History and Physical, chart, labs and discussed the procedure including the risks, benefits and alternatives for the proposed anesthesia with the patient or authorized representative who has indicated his/her understanding and acceptance.     Plan Discussed with: CRNA  Anesthesia Plan Comments:         Anesthesia Quick Evaluation

## 2015-10-23 NOTE — Anesthesia Postprocedure Evaluation (Signed)
Anesthesia Post Note  Patient: Cline CoolsDavid E Carmean  Procedure(s) Performed: Procedure(s) (LRB): ARTHROSCOPY KNEE, PARTIAL MEDIAL MENISECTOMY, PARTIAL SYNOVECTOMY (Right)  Patient location during evaluation: PACU Anesthesia Type: General Level of consciousness: awake Pain management: satisfactory to patient Vital Signs Assessment: post-procedure vital signs reviewed and stable Respiratory status: spontaneous breathing Cardiovascular status: blood pressure returned to baseline Anesthetic complications: no    Last Vitals:  Filed Vitals:   10/23/15 1328 10/23/15 1340  BP:  154/68  Pulse: 59 51  Temp:  36.4 C  Resp: 10 14    Last Pain:  Filed Vitals:   10/23/15 1342  PainSc: 2                  VAN STAVEREN,Eben Choinski

## 2015-10-23 NOTE — Discharge Instructions (Addendum)
Weightbearing as tolerated on right knee. Leave bandage on until follow-up visit. Keep dressing clean and dry. If dressing slides down leg remove entire dressing put 2 Band-Aids on incisions and rewrap only Ace wrapAMBULATORY SURGERY  DISCHARGE INSTRUCTIONS   1) The drugs that you were given will stay in your system until tomorrow so for the next 24 hours you should not:  A) Drive an automobile B) Make any legal decisions C) Drink any alcoholic beverage   2) You may resume regular meals tomorrow.  Today it is better to start with liquids and gradually work up to solid foods.  You may eat anything you prefer, but it is better to start with liquids, then soup and crackers, and gradually work up to solid foods.   3) Please notify your doctor immediately if you have any unusual bleeding, trouble breathing, redness and pain at the surgery site, drainage, fever, or pain not relieved by medication.    4) Additional Instructions:        Please contact your physician with any problems or Same Day Surgery at [phone], Monday through Friday 6 am to 4 pm, or [place] at [facility] Main number at [phone].

## 2015-10-23 NOTE — H&P (Signed)
Reviewed paper H+P, will be scanned into chart. No changes noted.  

## 2015-10-23 NOTE — Transfer of Care (Signed)
Immediate Anesthesia Transfer of Care Note  Patient: Adam CoolsDavid E Villa  Procedure(s) Performed: Procedure(s): ARTHROSCOPY KNEE, PARTIAL MEDIAL MENISECTOMY, PARTIAL SYNOVECTOMY (Right)  Patient Location: PACU  Anesthesia Type:General  Level of Consciousness: awake, alert , oriented and patient cooperative  Airway & Oxygen Therapy: Patient Spontanous Breathing and Patient connected to face mask oxygen  Post-op Assessment: Report given to RN, Post -op Vital signs reviewed and stable and Patient moving all extremities X 4  Post vital signs: Reviewed and stable  Last Vitals:  Filed Vitals:   10/23/15 1028  BP: 151/79  Pulse: 68  Temp: 36.6 C  Resp: 18    Complications: No apparent anesthesia complications

## 2015-10-23 NOTE — Op Note (Signed)
10/23/2015  12:34 PM  PATIENT:  Adam Villa  64 y.o. male  PRE-OPERATIVE DIAGNOSIS:  medial meniscus tear, arthritis  POST-OPERATIVE DIAGNOSIS:  medial meniscus tear, arthritis  PROCEDURE:  Procedure(s): ARTHROSCOPY KNEE, PARTIAL MEDIAL MENISECTOMY, PARTIAL SYNOVECTOMY (Right)  SURGEON: Leitha SchullerMichael J Aimi Essner, MD  ASSISTANTS: None  ANESTHESIA:   general  EBL:  Total I/O In: 700 [I.V.:700] Out: 10 [Blood:10]  BLOOD ADMINISTERED:none  DRAINS: none   LOCAL MEDICATIONS USED:  MARCAINE     SPECIMEN:  No Specimen  DISPOSITION OF SPECIMEN:  N/A  COUNTS:  YES  TOURNIQUET:   none  IMPLANTS: None  DICTATION: .Dragon Dictation patient brought the operating room and after adequate general anesthesia was obtained the right leg was placed in arthroscopic leg holder with a tourniquet applied but not inflated. After prepping and draping the sterile fashion appropriate patient identification and timeout procedures were completed inferolateral portal was made and the scope was introduced initial inspection revealed extensive synovitis in the suprapatellar pouch as well as in the anterior compartments medial and lateral there is significant patellofemoral degenerative changes with fissuring of the articular cartilage in both the trochlea and patella over the entire surfaces. Coming around medially and inferior medial portal was made and a probe was introduced there is a complex tear involving the posterior middle thirds leg most the posterior third with horizontal and flap tears present this is debrided with meniscal punch back to a stable margin followed by wand to smooth the edges. There was significant degenerative changes to the medial compartment on both femoral and tibial condyles with partial-thickness loss of the articular cartilage but without exposed bone. The anterior cruciate ligament was intact and lateral compartment was notable for some small areas of cartilage loss posterior tibial  condyle proximally 2 x 3 mm but meniscus was intact with some some degenerative changes present. At this point the express excessive synovitis in the anterior compartments and at the patellofemoral joint were debrided along with in the suprapatellar pouch after partial synovectomy was completed and the meniscus deemed stable the knee was irrigated until clear argentation was withdrawn 10 20 cc half percent Sensorcaine with epinephrine infiltrated near the portals and dressing applied Xeroform 4 x 4 web roll and Ace wrap.  PLAN OF CARE: Discharge to home after PACU  PATIENT DISPOSITION:  PACU - hemodynamically stable.

## 2015-10-23 NOTE — Anesthesia Procedure Notes (Signed)
Procedure Name: LMA Insertion Date/Time: 10/23/2015 11:44 AM Performed by: Michaele OfferSAVAGE, Twilla Khouri Pre-anesthesia Checklist: Patient identified, Emergency Drugs available, Suction available, Patient being monitored and Timeout performed Patient Re-evaluated:Patient Re-evaluated prior to inductionOxygen Delivery Method: Circle system utilized Preoxygenation: Pre-oxygenation with 100% oxygen Intubation Type: IV induction Ventilation: Mask ventilation without difficulty LMA: LMA inserted LMA Size: 4.5 Number of attempts: 1 Placement Confirmation: positive ETCO2 and breath sounds checked- equal and bilateral Tube secured with: Tape Dental Injury: Teeth and Oropharynx as per pre-operative assessment

## 2016-07-07 ENCOUNTER — Other Ambulatory Visit: Payer: Self-pay | Admitting: Orthopedic Surgery

## 2016-07-07 DIAGNOSIS — M170 Bilateral primary osteoarthritis of knee: Secondary | ICD-10-CM

## 2016-07-14 ENCOUNTER — Ambulatory Visit: Admission: RE | Admit: 2016-07-14 | Payer: Medicare Other | Source: Ambulatory Visit

## 2016-07-14 ENCOUNTER — Ambulatory Visit
Admission: RE | Admit: 2016-07-14 | Discharge: 2016-07-14 | Disposition: A | Discharge status: Home / Self Care | Payer: Medicare Other | Source: Ambulatory Visit | Attending: Orthopedic Surgery | Admitting: Orthopedic Surgery

## 2016-07-14 DIAGNOSIS — M25462 Effusion, left knee: Secondary | ICD-10-CM | POA: Insufficient documentation

## 2016-07-14 DIAGNOSIS — M170 Bilateral primary osteoarthritis of knee: Secondary | ICD-10-CM | POA: Diagnosis present

## 2016-07-14 DIAGNOSIS — M25461 Effusion, right knee: Secondary | ICD-10-CM | POA: Diagnosis not present

## 2016-07-18 IMAGING — MR MR KNEE*R* W/O CM
6 series · 40 of 40 positions shown · non-contrast
Comparison: MRI right knee 01/03/2009.

CLINICAL DATA: Medial and lateral right knee pain and limited range
of motion. No known injury. Symptoms are chronic. Initial encounter.

EXAM:
MRI OF THE RIGHT KNEE WITHOUT CONTRAST
TECHNIQUE: Multiplanar, multisequence MR imaging of the knee was performed. No
intravenous contrast was administered.

[Series 3: PD fat-sat · axial · 3.0mm · 0.62mm/px · z∈[-47,+65]mm · 9 of 35 slices shown (1 of 4)]
[im 1/35]
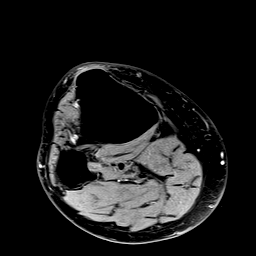
[im 5/35]
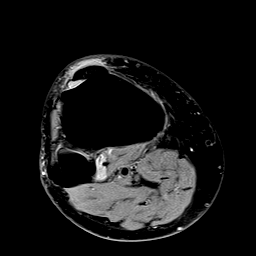
[im 9/35]
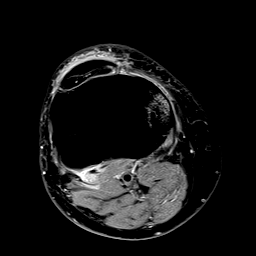
[im 13/35]
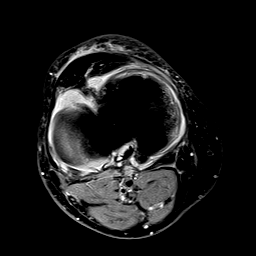
[im 18/35]
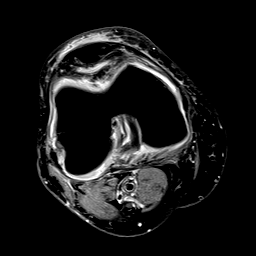
[im 22/35]
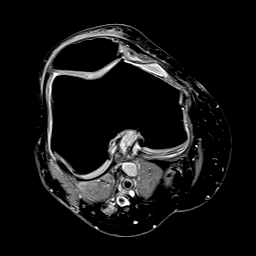
[im 26/35]
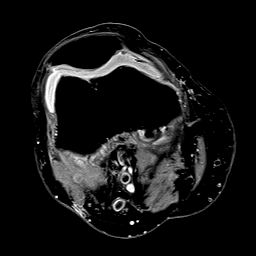
[im 30/35]
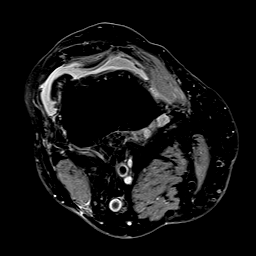
[im 35/35]
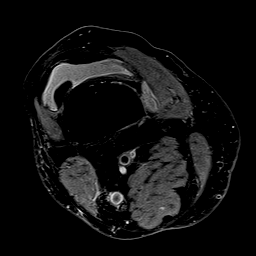

[Series 4: T1 · coronal · 3.0mm · 0.50mm/px · 7 of 32 slices shown]
[im 1/32]
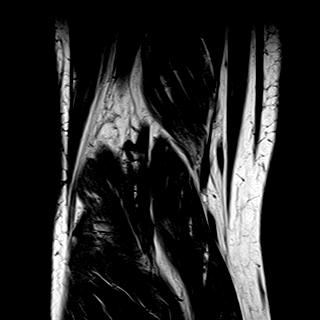
[im 6/32]
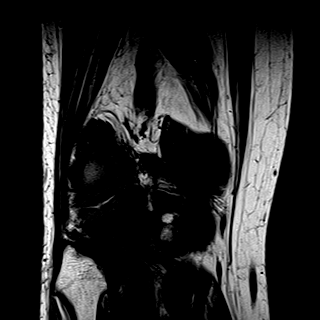
[im 11/32]
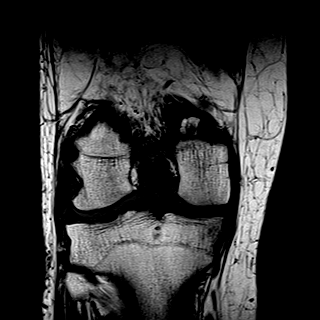
[im 16/32]
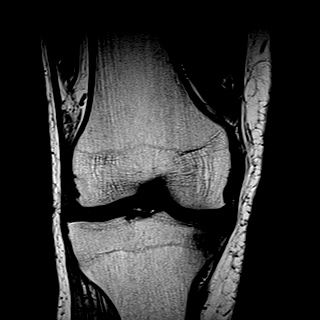
[im 21/32]
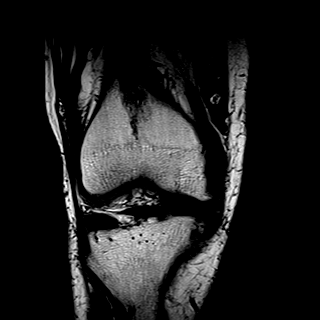
[im 26/32]
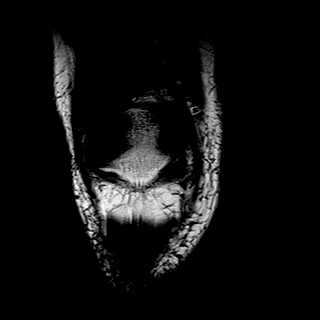
[im 32/32]
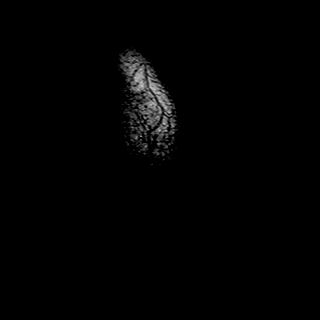

[Series 5: T2 fat-sat · coronal · 3.0mm · 0.31mm/px · 7 of 32 slices shown]
[im 1/32]
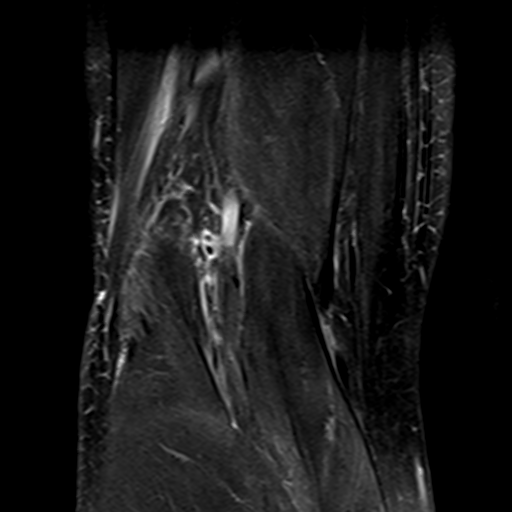
[im 6/32]
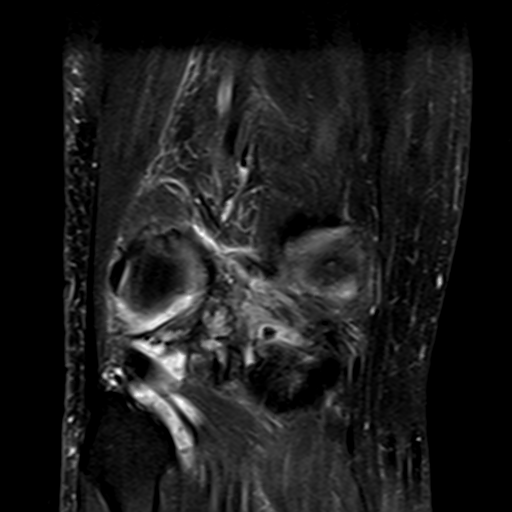
[im 11/32]
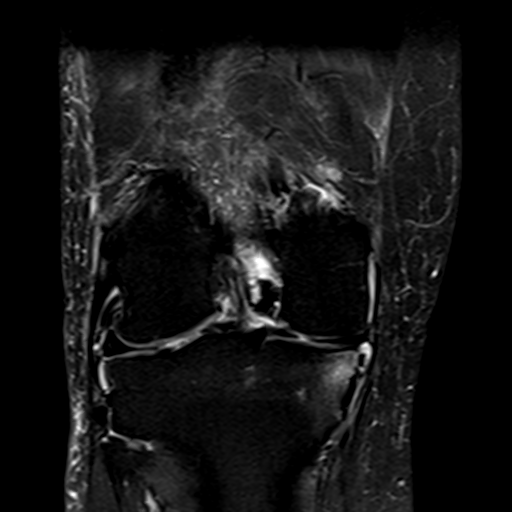
[im 16/32]
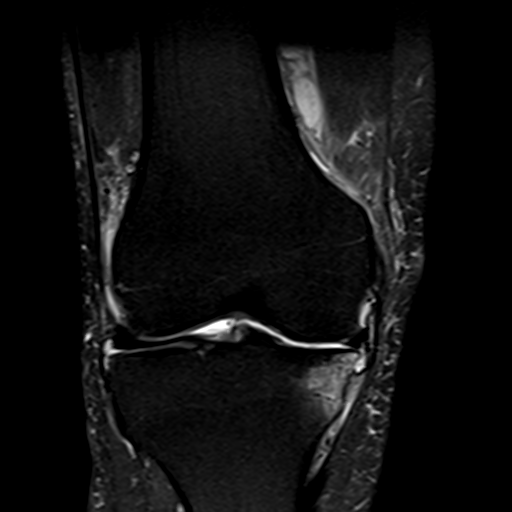
[im 21/32]
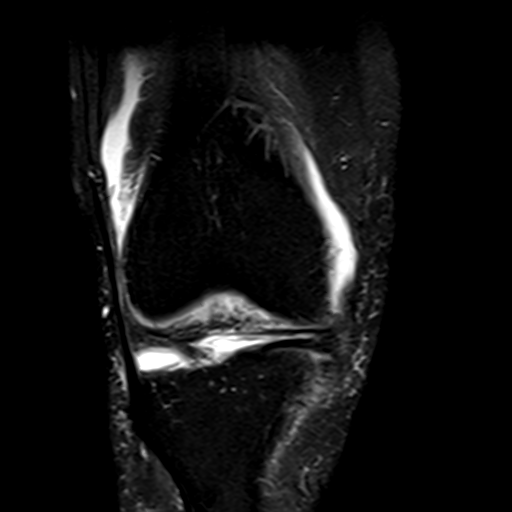
[im 26/32]
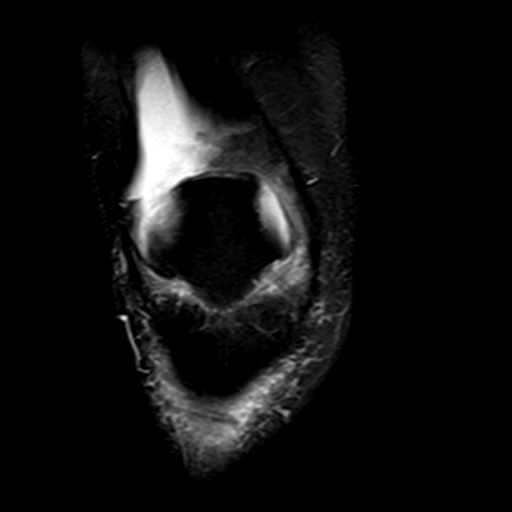
[im 32/32]
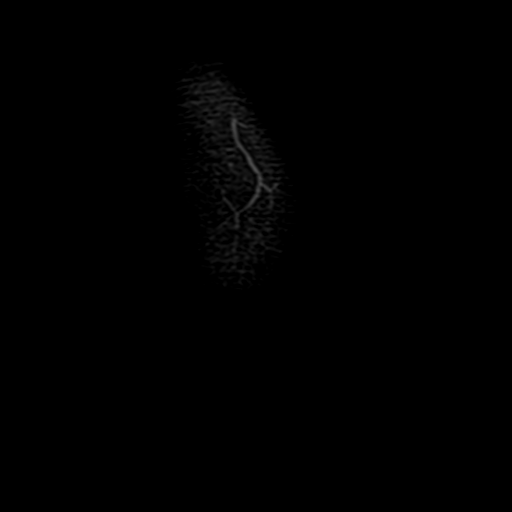

[Series 6: PD fat-sat · coronal · 3.0mm · 0.62mm/px · 7 of 32 slices shown (2 of 4)]
[im 1/32]
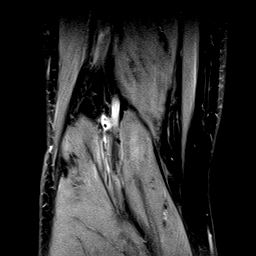
[im 6/32]
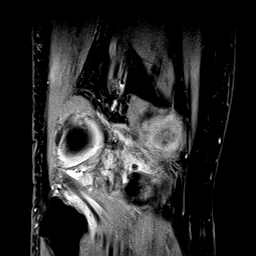
[im 11/32]
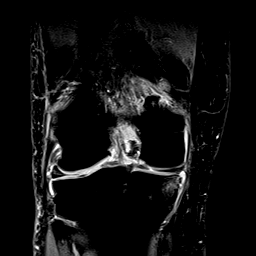
[im 16/32]
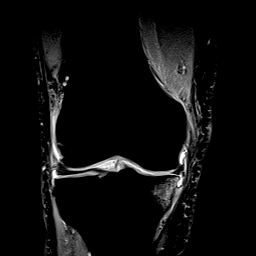
[im 21/32]
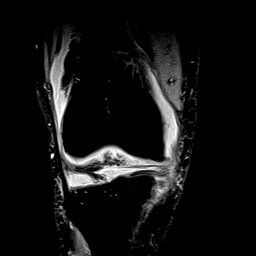
[im 26/32]
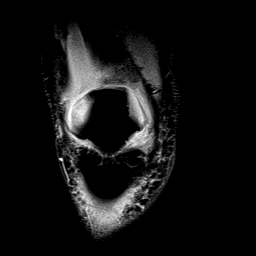
[im 32/32]
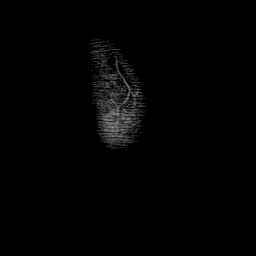

[Series 7: PD fat-sat · sagittal · 3.0mm · 0.62mm/px · 7 of 32 slices shown (3 of 4)]
[im 1/32]
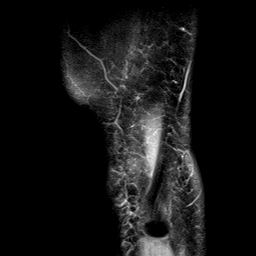
[im 6/32]
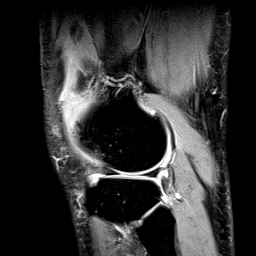
[im 11/32]
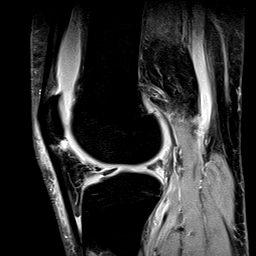
[im 16/32]
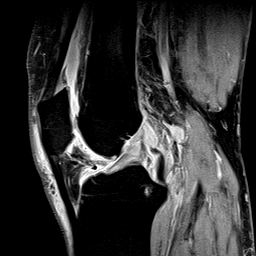
[im 21/32]
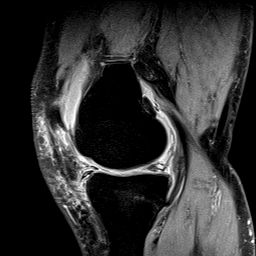
[im 26/32]
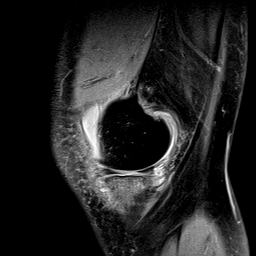
[im 32/32]
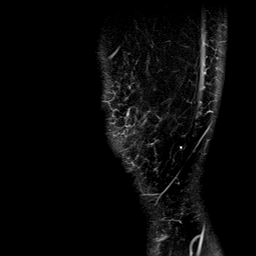

[Series 8: PD fat-sat · oblique · 2.0mm · 0.62mm/px · 3 of 11 slices shown (4 of 4)]
[im 1/11]
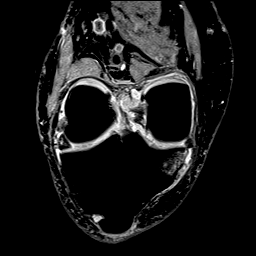
[im 6/11]
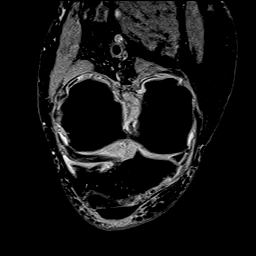
[im 11/11]
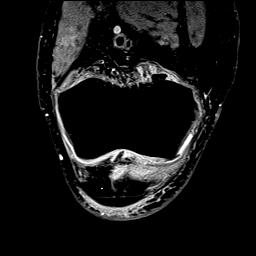

[40 of 40 positions shown; findings below may reference images not displayed]

FINDINGS: MENISCI

Medial meniscus: There is an oblique tear in the posterior horn of
the medial meniscus reaching the meniscal undersurface. An
associated intrameniscal cyst measuring approximately 0.6 cm AP by
0.4 cm craniocaudal by approximately 1.5 cm transverse is seen. A
focal radial tear is seen along the free edge of the posterior horn
peripheral to the meniscal root. The body of the medial meniscus is
markedly degenerated and diminutive. Longitudinal tear is seen in
the periphery of the anterior aspect of the body. No displaced
meniscal fragment is identified.

Lateral meniscus: Degenerative signal is seen in the posterior horn
and body but no tear is identified.

LIGAMENTS

Cruciates: Mucoid degeneration of the ACL without tear seen. The PCL
is unremarkable.

Collaterals:  Intact.

CARTILAGE

Patellofemoral: Fibrillation and irregularity are seen along the
lateral patellar facet and femoral trochlea without reactive marrow
signal change.

Medial:  Thinned throughout without focal defect.

Lateral:  Demonstrates some thinning and irregularity.

Joint:  Small joint effusion is noted.

Popliteal Fossa:  No Baker's cyst.

Extensor Mechanism:  Intact.

Bones: Marrow edema is seen about the periphery of the medial tibial
plateau related to degenerative disease. No fracture or worrisome
marrow lesion.
IMPRESSION: Extensive tearing of the medial meniscus is worst in the posterior
horn as described above.

Tricompartmental osteoarthritis appearing worst medially.

## 2016-07-27 HISTORY — PX: JOINT REPLACEMENT: SHX530

## 2016-07-30 ENCOUNTER — Encounter
Admission: RE | Admit: 2016-07-30 | Discharge: 2016-07-30 | Disposition: A | Discharge status: Home / Self Care | Payer: Medicare Other | Source: Ambulatory Visit | Attending: Orthopedic Surgery | Admitting: Orthopedic Surgery

## 2016-07-30 DIAGNOSIS — Z01812 Encounter for preprocedural laboratory examination: Secondary | ICD-10-CM | POA: Diagnosis not present

## 2016-07-30 DIAGNOSIS — M170 Bilateral primary osteoarthritis of knee: Secondary | ICD-10-CM | POA: Diagnosis not present

## 2016-07-30 DIAGNOSIS — Z01810 Encounter for preprocedural cardiovascular examination: Secondary | ICD-10-CM | POA: Insufficient documentation

## 2016-07-30 HISTORY — DX: Sleep apnea, unspecified: G47.30

## 2016-07-30 LAB — URINALYSIS COMPLETE WITH MICROSCOPIC (ARMC ONLY)
BACTERIA UA: NONE SEEN
Bilirubin Urine: NEGATIVE
Glucose, UA: NEGATIVE mg/dL
HGB URINE DIPSTICK: NEGATIVE
LEUKOCYTES UA: NEGATIVE
Nitrite: NEGATIVE
PROTEIN: 100 mg/dL — AB
SPECIFIC GRAVITY, URINE: 1.024 (ref 1.005–1.030)
SQUAMOUS EPITHELIAL / LPF: NONE SEEN
pH: 7 (ref 5.0–8.0)

## 2016-07-30 LAB — CBC
HEMATOCRIT: 44 % (ref 40.0–52.0)
Hemoglobin: 14.3 g/dL (ref 13.0–18.0)
MCH: 29.3 pg (ref 26.0–34.0)
MCHC: 32.6 g/dL (ref 32.0–36.0)
MCV: 89.8 fL (ref 80.0–100.0)
PLATELETS: 247 10*3/uL (ref 150–440)
RBC: 4.89 MIL/uL (ref 4.40–5.90)
RDW: 13.7 % (ref 11.5–14.5)
WBC: 8 10*3/uL (ref 3.8–10.6)

## 2016-07-30 LAB — SURGICAL PCR SCREEN
MRSA, PCR: NEGATIVE
STAPHYLOCOCCUS AUREUS: NEGATIVE

## 2016-07-30 LAB — BASIC METABOLIC PANEL
ANION GAP: 6 (ref 5–15)
BUN: 20 mg/dL (ref 6–20)
CALCIUM: 9.2 mg/dL (ref 8.9–10.3)
CO2: 29 mmol/L (ref 22–32)
CREATININE: 0.81 mg/dL (ref 0.61–1.24)
Chloride: 104 mmol/L (ref 101–111)
GFR calc non Af Amer: >60 mL/min (ref >60)
GLUCOSE: 96 mg/dL (ref 65–99)
Potassium: 3.8 mmol/L (ref 3.5–5.1)
SODIUM: 139 mmol/L (ref 135–145)

## 2016-07-30 LAB — PROTIME-INR
INR: 1
Prothrombin Time: 13.2 seconds (ref 11.4–15.2)

## 2016-07-30 LAB — TYPE AND SCREEN
ABO/RH(D): A POS
ANTIBODY SCREEN: NEGATIVE

## 2016-07-30 LAB — APTT: aPTT: 31 seconds (ref 24–36)

## 2016-07-30 LAB — SEDIMENTATION RATE: Sed Rate: 12 mm/hr (ref 0–20)

## 2016-07-30 NOTE — Patient Instructions (Signed)
Your procedure is scheduled on: 08/12/16 Tue Report to Day Surgery. 2nd floor medical mall entrance To find out your arrival time please call [phone] between 1PM - 3PM on 08/11/16. Mon  Remember: Instructions that are not followed completely may result in serious medical risk, up to and including death, or upon the discretion of your surgeon and anesthesiologist your surgery may need to be rescheduled.    __X__ 1. Do not eat food or drink liquids after midnight. No gum chewing or hard candies.     __X__ 2. No Alcohol/no smoking for 24 hours before or after surgery.   ____ 3. Bring all medications with you on the day of surgery if instructed.    __X__ 4. Notify your doctor if there is any change in your medical condition     (cold, fever, infections).     Do not wear jewelry, make-up, hairpins, clips or nail polish.  Do not wear lotions, powders, or perfumes.   Do not shave 48 hours prior to surgery. Men may shave face and neck.  Do not bring valuables to the hospital.    [facility] not responsible for any belongings or valuables.               Contacts, dentures or bridgework may not be worn into surgery.  Leave your suitcase in the car. After surgery it may be brought to your room.  For patients admitted to the hospital, discharge time is determined by your                treatment team.   Patients discharged the day of surgery will not be allowed to drive home.   Please read over the following fact sheets that you were given:   MRSA Information   __x__ Take these medicines the morning of surgery with A SIP OF WATER:    1. atorvastatin  2. prednisone  3.   4.  5.  6.  ____ Fleet Enema (as directed)   __x__ Use CHG Soap as directed  ____ Use inhalers on the day of surgery  ____ Stop metformin 2 days prior to surgery    ____ Take 1/2 of usual insulin dose the night before surgery and none on the morning of surgery.   __x__ Stop Coumadin/Plavix/aspirin on 7  days before surgery  ____ Stop Anti-inflammatories on    ____ Stop supplements until after surgery.    __x__ Bring C-Pap to the hospital.

## 2016-07-31 LAB — URINE CULTURE: Culture: NO GROWTH

## 2016-08-12 ENCOUNTER — Inpatient Hospital Stay: Payer: Medicare Other | Admitting: Anesthesiology

## 2016-08-12 ENCOUNTER — Inpatient Hospital Stay
Admission: RE | Admit: 2016-08-12 | Discharge: 2016-08-14 | DRG: 470 | Disposition: A | Discharge status: Home Health | Payer: Medicare Other | Source: Ambulatory Visit | Attending: Orthopedic Surgery | Admitting: Orthopedic Surgery

## 2016-08-12 ENCOUNTER — Inpatient Hospital Stay: Payer: Medicare Other

## 2016-08-12 ENCOUNTER — Encounter: Payer: Self-pay | Admitting: *Deleted

## 2016-08-12 ENCOUNTER — Encounter
Admission: RE | Disposition: A | Discharge status: Home Health | Payer: Self-pay | Source: Ambulatory Visit | Attending: Orthopedic Surgery

## 2016-08-12 DIAGNOSIS — M1711 Unilateral primary osteoarthritis, right knee: Principal | ICD-10-CM | POA: Diagnosis present

## 2016-08-12 DIAGNOSIS — M25462 Effusion, left knee: Secondary | ICD-10-CM | POA: Diagnosis present

## 2016-08-12 DIAGNOSIS — Z8673 Personal history of transient ischemic attack (TIA), and cerebral infarction without residual deficits: Secondary | ICD-10-CM

## 2016-08-12 DIAGNOSIS — R2689 Other abnormalities of gait and mobility: Secondary | ICD-10-CM

## 2016-08-12 DIAGNOSIS — K219 Gastro-esophageal reflux disease without esophagitis: Secondary | ICD-10-CM | POA: Diagnosis present

## 2016-08-12 DIAGNOSIS — M25461 Effusion, right knee: Secondary | ICD-10-CM | POA: Diagnosis present

## 2016-08-12 DIAGNOSIS — Z79899 Other long term (current) drug therapy: Secondary | ICD-10-CM

## 2016-08-12 DIAGNOSIS — Z880 Allergy status to penicillin: Secondary | ICD-10-CM

## 2016-08-12 DIAGNOSIS — Z7982 Long term (current) use of aspirin: Secondary | ICD-10-CM

## 2016-08-12 DIAGNOSIS — D62 Acute posthemorrhagic anemia: Secondary | ICD-10-CM | POA: Diagnosis not present

## 2016-08-12 DIAGNOSIS — R112 Nausea with vomiting, unspecified: Secondary | ICD-10-CM | POA: Diagnosis not present

## 2016-08-12 DIAGNOSIS — H9191 Unspecified hearing loss, right ear: Secondary | ICD-10-CM | POA: Diagnosis present

## 2016-08-12 DIAGNOSIS — G8918 Other acute postprocedural pain: Secondary | ICD-10-CM

## 2016-08-12 DIAGNOSIS — I10 Essential (primary) hypertension: Secondary | ICD-10-CM | POA: Diagnosis present

## 2016-08-12 DIAGNOSIS — M1712 Unilateral primary osteoarthritis, left knee: Secondary | ICD-10-CM | POA: Diagnosis present

## 2016-08-12 DIAGNOSIS — Z23 Encounter for immunization: Secondary | ICD-10-CM

## 2016-08-12 DIAGNOSIS — M6281 Muscle weakness (generalized): Secondary | ICD-10-CM

## 2016-08-12 DIAGNOSIS — M25561 Pain in right knee: Secondary | ICD-10-CM | POA: Diagnosis present

## 2016-08-12 HISTORY — PX: TOTAL KNEE ARTHROPLASTY: SHX125

## 2016-08-12 LAB — ABO/RH: ABO/RH(D): A POS

## 2016-08-12 LAB — CREATININE, SERUM
CREATININE: 0.82 mg/dL (ref 0.61–1.24)
GFR calc non Af Amer: >60 mL/min (ref >60)

## 2016-08-12 LAB — CBC
HCT: 38.3 % — ABNORMAL LOW (ref 40.0–52.0)
Hemoglobin: 13 g/dL (ref 13.0–18.0)
MCH: 30.2 pg (ref 26.0–34.0)
MCHC: 34 g/dL (ref 32.0–36.0)
MCV: 88.9 fL (ref 80.0–100.0)
PLATELETS: 202 10*3/uL (ref 150–440)
RBC: 4.31 MIL/uL — AB (ref 4.40–5.90)
RDW: 13.7 % (ref 11.5–14.5)
WBC: 6.2 10*3/uL (ref 3.8–10.6)

## 2016-08-12 SURGERY — ARTHROPLASTY, KNEE, TOTAL
Anesthesia: Spinal | Laterality: Left | Wound class: Clean

## 2016-08-12 MED ORDER — FAMOTIDINE 20 MG PO TABS
20.0000 mg | ORAL_TABLET | Freq: Once | ORAL | Status: AC
  Administered 2016-08-12: 20 mg via ORAL

## 2016-08-12 MED ORDER — ZOLPIDEM TARTRATE 5 MG PO TABS
5.0000 mg | ORAL_TABLET | Freq: Every evening | ORAL | Status: DC | PRN
  Administered 2016-08-13: 5 mg via ORAL
  Filled 2016-08-12: qty 1

## 2016-08-12 MED ORDER — ONDANSETRON HCL 4 MG/2ML IJ SOLN
4.0000 mg | Freq: Four times a day (QID) | INTRAMUSCULAR | Status: DC | PRN
  Administered 2016-08-12 – 2016-08-13 (×2): 4 mg via INTRAVENOUS
  Filled 2016-08-12 (×2): qty 2

## 2016-08-12 MED ORDER — ACETAMINOPHEN 650 MG RE SUPP: 650.0000 mg | Freq: Four times a day (QID) | RECTAL | Status: DC | PRN

## 2016-08-12 MED ORDER — MAGNESIUM HYDROXIDE 400 MG/5ML PO SUSP
30.0000 mL | Freq: Every day | ORAL | Status: DC | PRN
Start: 2016-08-12 — End: 2016-08-14

## 2016-08-12 MED ORDER — NEOMYCIN-POLYMYXIN B GU 40-200000 IR SOLN
Status: DC | PRN
  Administered 2016-08-12: 16 mL

## 2016-08-12 MED ORDER — ACETAMINOPHEN 10 MG/ML IV SOLN
INTRAVENOUS | Status: AC
  Filled 2016-08-12: qty 100

## 2016-08-12 MED ORDER — METOCLOPRAMIDE HCL 5 MG/ML IJ SOLN
5.0000 mg | Freq: Three times a day (TID) | INTRAMUSCULAR | Status: DC | PRN
  Administered 2016-08-12: 10 mg via INTRAVENOUS
  Filled 2016-08-12: qty 2

## 2016-08-12 MED ORDER — CLINDAMYCIN PHOSPHATE 900 MG/50ML IV SOLN
900.0000 mg | Freq: Four times a day (QID) | INTRAVENOUS | Status: AC
Start: 2016-08-12 — End: 2016-08-13
  Administered 2016-08-12 – 2016-08-13 (×3): 900 mg via INTRAVENOUS
  Filled 2016-08-12 (×3): qty 50

## 2016-08-12 MED ORDER — EPHEDRINE SULFATE 50 MG/ML IJ SOLN
INTRAMUSCULAR | Status: DC | PRN
  Administered 2016-08-12: 10 mg via INTRAVENOUS

## 2016-08-12 MED ORDER — ASPIRIN 325 MG PO TABS
650.0000 mg | ORAL_TABLET | Freq: Every day | ORAL | Status: DC
  Administered 2016-08-12 – 2016-08-14 (×3): 650 mg via ORAL
  Filled 2016-08-12 (×3): qty 2

## 2016-08-12 MED ORDER — MORPHINE SULFATE (PF) 2 MG/ML IV SOLN
2.0000 mg | INTRAVENOUS | Status: DC | PRN
  Administered 2016-08-12 – 2016-08-13 (×4): 2 mg via INTRAVENOUS
  Filled 2016-08-12 (×4): qty 1

## 2016-08-12 MED ORDER — OMEGA-3-ACID ETHYL ESTERS 1 G PO CAPS
1.0000 | ORAL_CAPSULE | Freq: Every day | ORAL | Status: DC
  Administered 2016-08-12 – 2016-08-14 (×3): 1 g via ORAL
  Filled 2016-08-12 (×3): qty 1

## 2016-08-12 MED ORDER — MAGNESIUM CITRATE PO SOLN
1.0000 | Freq: Once | ORAL | Status: AC | PRN
  Administered 2016-08-14: 1 via ORAL
  Filled 2016-08-12: qty 296

## 2016-08-12 MED ORDER — MORPHINE SULFATE 10 MG/ML IJ SOLN
INTRAMUSCULAR | Status: DC | PRN
  Administered 2016-08-12: 10 mg via SUBCUTANEOUS

## 2016-08-12 MED ORDER — OXYCODONE HCL 5 MG PO TABS
5.0000 mg | ORAL_TABLET | ORAL | Status: DC | PRN
  Administered 2016-08-12: 10 mg via ORAL
  Administered 2016-08-12: 5 mg via ORAL
  Administered 2016-08-13 (×2): 10 mg via ORAL
  Administered 2016-08-13: 5 mg via ORAL
  Administered 2016-08-13 – 2016-08-14 (×3): 10 mg via ORAL
  Filled 2016-08-12: qty 2
  Filled 2016-08-12: qty 1
  Filled 2016-08-12 (×2): qty 2
  Filled 2016-08-12: qty 1
  Filled 2016-08-12 (×3): qty 2

## 2016-08-12 MED ORDER — AMLODIPINE BESYLATE 10 MG PO TABS
10.0000 mg | ORAL_TABLET | Freq: Every day | ORAL | Status: DC
  Administered 2016-08-12 – 2016-08-13 (×2): 10 mg via ORAL
  Filled 2016-08-12 (×2): qty 1

## 2016-08-12 MED ORDER — KETOROLAC TROMETHAMINE 30 MG/ML IJ SOLN
30.0000 mg | Freq: Once | INTRAMUSCULAR | Status: AC
  Administered 2016-08-12: 30 mg via INTRAVENOUS
  Filled 2016-08-12: qty 1

## 2016-08-12 MED ORDER — MIDAZOLAM HCL 5 MG/5ML IJ SOLN
INTRAMUSCULAR | Status: DC | PRN
  Administered 2016-08-12: 2 mg via INTRAVENOUS

## 2016-08-12 MED ORDER — ONDANSETRON HCL 4 MG PO TABS: 4.0000 mg | ORAL_TABLET | Freq: Four times a day (QID) | ORAL | Status: DC | PRN

## 2016-08-12 MED ORDER — ACETAMINOPHEN 325 MG PO TABS: 650.0000 mg | ORAL_TABLET | Freq: Four times a day (QID) | ORAL | Status: DC | PRN

## 2016-08-12 MED ORDER — FENTANYL CITRATE (PF) 100 MCG/2ML IJ SOLN: 25.0000 ug | INTRAMUSCULAR | Status: DC | PRN

## 2016-08-12 MED ORDER — ONDANSETRON HCL 4 MG/2ML IJ SOLN: 4.0000 mg | Freq: Once | INTRAMUSCULAR | Status: DC | PRN

## 2016-08-12 MED ORDER — BUPIVACAINE-EPINEPHRINE (PF) 0.25% -1:200000 IJ SOLN
INTRAMUSCULAR | Status: DC | PRN
  Administered 2016-08-12: 30 mL

## 2016-08-12 MED ORDER — SODIUM CHLORIDE 0.9 % IV SOLN
INTRAVENOUS | Status: DC
  Administered 2016-08-12 – 2016-08-13 (×4): via INTRAVENOUS

## 2016-08-12 MED ORDER — METHOCARBAMOL 500 MG PO TABS
500.0000 mg | ORAL_TABLET | Freq: Four times a day (QID) | ORAL | Status: DC | PRN
  Administered 2016-08-13 (×2): 500 mg via ORAL
  Filled 2016-08-12 (×2): qty 1

## 2016-08-12 MED ORDER — SODIUM CHLORIDE 0.9 % IV SOLN
INTRAVENOUS | Status: DC | PRN
  Administered 2016-08-12: 60 mL

## 2016-08-12 MED ORDER — DOCUSATE SODIUM 100 MG PO CAPS
100.0000 mg | ORAL_CAPSULE | Freq: Two times a day (BID) | ORAL | Status: DC
  Administered 2016-08-12 – 2016-08-14 (×5): 100 mg via ORAL
  Filled 2016-08-12 (×5): qty 1

## 2016-08-12 MED ORDER — ACETAMINOPHEN 10 MG/ML IV SOLN
INTRAVENOUS | Status: DC | PRN
  Administered 2016-08-12: 1000 mg via INTRAVENOUS

## 2016-08-12 MED ORDER — METOCLOPRAMIDE HCL 10 MG PO TABS: 5.0000 mg | ORAL_TABLET | Freq: Three times a day (TID) | ORAL | Status: DC | PRN

## 2016-08-12 MED ORDER — MORPHINE SULFATE (PF) 10 MG/ML IV SOLN
INTRAVENOUS | Status: AC
  Filled 2016-08-12: qty 1

## 2016-08-12 MED ORDER — PROPOFOL 500 MG/50ML IV EMUL
INTRAVENOUS | Status: DC | PRN
  Administered 2016-08-12: 75 ug/kg/min via INTRAVENOUS

## 2016-08-12 MED ORDER — TRANEXAMIC ACID 1000 MG/10ML IV SOLN
INTRAVENOUS | Status: DC | PRN
  Administered 2016-08-12: 1000 mg via INTRAVENOUS

## 2016-08-12 MED ORDER — BISACODYL 10 MG RE SUPP
10.0000 mg | Freq: Every day | RECTAL | Status: DC | PRN
  Administered 2016-08-14: 10 mg via RECTAL
  Filled 2016-08-12: qty 1

## 2016-08-12 MED ORDER — MENTHOL 3 MG MT LOZG: 1.0000 | LOZENGE | OROMUCOSAL | Status: DC | PRN

## 2016-08-12 MED ORDER — BUPIVACAINE HCL (PF) 0.5 % IJ SOLN
INTRAMUSCULAR | Status: DC | PRN
  Administered 2016-08-12: 3 mL

## 2016-08-12 MED ORDER — ENOXAPARIN SODIUM 30 MG/0.3ML ~~LOC~~ SOLN
30.0000 mg | Freq: Two times a day (BID) | SUBCUTANEOUS | Status: DC
  Administered 2016-08-13 – 2016-08-14 (×3): 30 mg via SUBCUTANEOUS
  Filled 2016-08-12 (×3): qty 0.3

## 2016-08-12 MED ORDER — TRANEXAMIC ACID 1000 MG/10ML IV SOLN
INTRAVENOUS | Status: AC
  Filled 2016-08-12: qty 10

## 2016-08-12 MED ORDER — PHENOL 1.4 % MT LIQD: 1.0000 | OROMUCOSAL | Status: DC | PRN

## 2016-08-12 MED ORDER — DIPHENHYDRAMINE HCL 12.5 MG/5ML PO ELIX: 12.5000 mg | ORAL_SOLUTION | ORAL | Status: DC | PRN

## 2016-08-12 MED ORDER — ATORVASTATIN CALCIUM 20 MG PO TABS
40.0000 mg | ORAL_TABLET | Freq: Every morning | ORAL | Status: DC
  Administered 2016-08-13 – 2016-08-14 (×2): 40 mg via ORAL
  Filled 2016-08-12 (×2): qty 2

## 2016-08-12 MED ORDER — LACTATED RINGERS IV SOLN
INTRAVENOUS | Status: DC
  Administered 2016-08-12: 75 mL/h via INTRAVENOUS
  Administered 2016-08-12: 12:00:00 via INTRAVENOUS

## 2016-08-12 MED ORDER — VITAMIN D 1000 UNITS PO TABS
2000.0000 [IU] | ORAL_TABLET | Freq: Every day | ORAL | Status: DC
  Administered 2016-08-12 – 2016-08-14 (×3): 2000 [IU] via ORAL
  Filled 2016-08-12 (×3): qty 2

## 2016-08-12 MED ORDER — INFLUENZA VAC SPLIT QUAD 0.5 ML IM SUSY
0.5000 mL | PREFILLED_SYRINGE | INTRAMUSCULAR | Status: AC
  Administered 2016-08-13: 0.5 mL via INTRAMUSCULAR
  Filled 2016-08-12 (×2): qty 0.5

## 2016-08-12 MED ORDER — ONDANSETRON HCL 4 MG/2ML IJ SOLN
INTRAMUSCULAR | Status: DC | PRN
  Administered 2016-08-12: 4 mg via INTRAVENOUS

## 2016-08-12 MED ORDER — PREDNISONE 1 MG PO TABS
2.0000 mg | ORAL_TABLET | Freq: Every day | ORAL | Status: DC
  Administered 2016-08-13 – 2016-08-14 (×2): 2 mg via ORAL
  Filled 2016-08-12 (×2): qty 2

## 2016-08-12 MED ORDER — METHOCARBAMOL 1000 MG/10ML IJ SOLN
500.0000 mg | Freq: Four times a day (QID) | INTRAVENOUS | Status: DC | PRN
  Filled 2016-08-12: qty 5

## 2016-08-12 MED ORDER — CLINDAMYCIN PHOSPHATE 900 MG/50ML IV SOLN
900.0000 mg | Freq: Once | INTRAVENOUS | Status: AC
Start: 2016-08-12 — End: 2016-08-12
  Administered 2016-08-12: 900 mg via INTRAVENOUS

## 2016-08-12 SURGICAL SUPPLY — 60 items
BANDAGE ACE 6X5 VEL STRL LF (GAUZE/BANDAGES/DRESSINGS) ×3 IMPLANT
BLADE SAW 1 (BLADE) ×3 IMPLANT
BLOCK CUTTING FEMUR 4 LT MED (MISCELLANEOUS) IMPLANT
BLOCK CUTTING TIBIAL 4 LT CT (MISCELLANEOUS) IMPLANT
CANISTER SUCT 1200ML W/VALVE (MISCELLANEOUS) ×3 IMPLANT
CANISTER SUCT 3000ML (MISCELLANEOUS) ×6 IMPLANT
CAPT KNEE TOTAL 3 ×3 IMPLANT
CATH FOL LEG HOLDER (MISCELLANEOUS) ×3 IMPLANT
CATH TRAY METER 16FR LF (MISCELLANEOUS) ×3 IMPLANT
CEMENT HV SMART SET (Cement) ×6 IMPLANT
CHLORAPREP W/TINT 26ML (MISCELLANEOUS) ×6 IMPLANT
COOLER POLAR GLACIER W/PUMP (MISCELLANEOUS) ×3 IMPLANT
CUFF TOURN 24 STER (MISCELLANEOUS) IMPLANT
CUFF TOURN 30 STER DUAL PORT (MISCELLANEOUS) ×3 IMPLANT
DRAPE INCISE IOBAN 66X45 STRL (DRAPES) ×6 IMPLANT
DRAPE SHEET LG 3/4 BI-LAMINATE (DRAPES) ×6 IMPLANT
ELECT CAUTERY BLADE 6.4 (BLADE) ×3 IMPLANT
ELECT REM PT RETURN 9FT ADLT (ELECTROSURGICAL) ×3
ELECTRODE REM PT RTRN 9FT ADLT (ELECTROSURGICAL) ×1 IMPLANT
GAUZE PETRO XEROFOAM 1X8 (MISCELLANEOUS) ×3 IMPLANT
GAUZE SPONGE 4X4 12PLY STRL (GAUZE/BANDAGES/DRESSINGS) ×3 IMPLANT
GLOVE BIOGEL PI IND STRL 9 (GLOVE) ×1 IMPLANT
GLOVE BIOGEL PI INDICATOR 9 (GLOVE) ×2
GLOVE INDICATOR 8.0 STRL GRN (GLOVE) ×3 IMPLANT
GLOVE SURG ORTHO 8.0 STRL STRW (GLOVE) ×3 IMPLANT
GLOVE SURG SYN 9.0  PF PI (GLOVE) ×2
GLOVE SURG SYN 9.0 PF PI (GLOVE) ×1 IMPLANT
GOWN SRG 2XL LVL 4 RGLN SLV (GOWNS) ×1 IMPLANT
GOWN STRL NON-REIN 2XL LVL4 (GOWNS) ×2
GOWN STRL REUS W/ TWL LRG LVL3 (GOWN DISPOSABLE) ×1 IMPLANT
GOWN STRL REUS W/ TWL XL LVL3 (GOWN DISPOSABLE) ×1 IMPLANT
GOWN STRL REUS W/TWL LRG LVL3 (GOWN DISPOSABLE) ×2
GOWN STRL REUS W/TWL XL LVL3 (GOWN DISPOSABLE) ×2
HANDPIECE INTERPULSE COAX TIP (DISPOSABLE) ×2
HOOD PEEL AWAY FLYTE STAYCOOL (MISCELLANEOUS) ×6 IMPLANT
IMMBOLIZER KNEE 19 BLUE UNIV (SOFTGOODS) ×3 IMPLANT
KIT RM TURNOVER STRD PROC AR (KITS) ×3 IMPLANT
KNEE MEDACTA TIBIAL/FEMORAL BL (Knees) ×3 IMPLANT
KNIFE SCULPS 14X20 (INSTRUMENTS) ×3 IMPLANT
NDL SAFETY 18GX1.5 (NEEDLE) ×3 IMPLANT
NEEDLE SPNL 18GX3.5 QUINCKE PK (NEEDLE) ×3 IMPLANT
NEEDLE SPNL 20GX3.5 QUINCKE YW (NEEDLE) ×3 IMPLANT
NS IRRIG 1000ML POUR BTL (IV SOLUTION) ×3 IMPLANT
PACK TOTAL KNEE (MISCELLANEOUS) ×3 IMPLANT
PAD WRAPON POLAR KNEE (MISCELLANEOUS) ×1 IMPLANT
SET HNDPC FAN SPRY TIP SCT (DISPOSABLE) ×1 IMPLANT
SOL .9 NS 3000ML IRR  AL (IV SOLUTION) ×2
SOL .9 NS 3000ML IRR UROMATIC (IV SOLUTION) ×1 IMPLANT
STAPLER SKIN PROX 35W (STAPLE) ×3 IMPLANT
SUCTION FRAZIER HANDLE 10FR (MISCELLANEOUS) ×2
SUCTION TUBE FRAZIER 10FR DISP (MISCELLANEOUS) ×1 IMPLANT
SUT DVC 2 QUILL PDO  T11 36X36 (SUTURE) ×2
SUT DVC 2 QUILL PDO T11 36X36 (SUTURE) ×1 IMPLANT
SUT DVC QUILL MONODERM 30X30 (SUTURE) ×3 IMPLANT
SYR 20CC LL (SYRINGE) ×3 IMPLANT
SYR 50ML LL SCALE MARK (SYRINGE) ×6 IMPLANT
TIBIAL BONE MODEL LEFT (MISCELLANEOUS) IMPLANT
TOWEL OR 17X26 4PK STRL BLUE (TOWEL DISPOSABLE) ×3 IMPLANT
TOWER CARTRIDGE SMART MIX (DISPOSABLE) ×3 IMPLANT
WRAPON POLAR PAD KNEE (MISCELLANEOUS) ×3

## 2016-08-12 NOTE — Op Note (Signed)
08/12/2016  12:37 PM  PATIENT:  Adam Villa  65 y.o. male  PRE-OPERATIVE DIAGNOSIS:  PRIMARY OSTEOARTHRITIS LEFT knee  POST-OPERATIVE DIAGNOSIS:  PRIMARY OSTEOARTHRITIS LEFT knee  PROCEDURE:  Procedure(s): TOTAL KNEE ARTHROPLASTY (Left)  SURGEON: Leitha SchullerMichael J Kendyll Huettner, MD  ASSISTANTS: [facility]  ANESTHESIA:   spinal  EBL:  Total I/O In: 1200 [I.V.:1200] Out: 340 [Urine:140; Blood:200]  BLOOD ADMINISTERED:none  DRAINS: none   LOCAL MEDICATIONS USED:  MARCAINE    and OTHER morphine, Exparel  SPECIMEN:  No Specimen  DISPOSITION OF SPECIMEN:  N/A  COUNTS:  YES  TOURNIQUET:   22 minutes at 300 mmHg  IMPLANTS: Medacta GMK sphere left 4 femur, 4 tibia with 10 mm insert, 3 patella, short stem on tibia, all components cemented  DICTATION: .Dragon Dictation DICTATION: Reubin Milan.Dragon Dictation patient was brought the operating room and after adequate spinal anesthesia was obtained left leg was prepped and draped in sterile fashion. After patient identification and timeout procedures were completed midline skin incision was made followed by medial parapatellar arthrotomy.t. Inspection revealed eburnated bone with mild bone loss in the medial compartment , lateral compartment had mild degenerative changes and the patellofemoral joint was bone-on-bone as well with extensive spurring. The anterior cruciate ligament, fat pad and PCL were excised at this time. The proximal tibia was exposed for the Medacta cutting block and anterior application of the block was performed followed by proximal tibia cut and removal approximately tibia bone. The menisci were excised at this time. Distal femoral cut made using the Medacta cutting guide. 4-in-1 cutting block applied anterior posterior chamfer cuts carried out. The tibia was prepared be placing the template and then drilling the proximal femur for short stem and placing the keel punch holding this in position the 4 femur was applied and distal femoral  drill holes made. A 10 mm insert gave excellent stability through range of motion the this point the trials were removed and the notch cut made for the trochlear groove. Next the patella was cut using the patellar cutting guide patella sized to size 3 after 3 drill holes were made. At this point  hemostasis checked electrocautery and the above local anesthetic infiltrated around the joint. The tourniquet was raised and the bony surfaces thoroughly irrigated and dried components were cemented into place after the cemented set excess cement was removed. The patella tracked well with no touch technique. Tourniquet was again let down and then the arthrotomy repaired using a heavy Quill, 2-0  Quill substantially and skin staples. Xeroform 4 x 4's ABDs and web roll Polar Care and Ace wrap applied  PLAN OF CARE: Admit to inpatient   PATIENT DISPOSITION:  PACU - hemodynamically stable.

## 2016-08-12 NOTE — Anesthesia Procedure Notes (Addendum)
Spinal  Patient location during procedure: OR Start time: 08/12/2016 10:32 AM End time: 08/12/2016 10:38 AM Staffing Anesthesiologist: Naomie DeanKEPHART, WILLIAM K Resident/CRNA: Marlana SalvageJESSUP, Leontina Skidmore Performed: resident/CRNA  Preanesthetic Checklist Completed: patient identified, site marked, surgical consent, pre-op evaluation, timeout performed, IV checked, risks and benefits discussed and monitors and equipment checked Spinal Block Patient position: sitting Prep: Betadine and site prepped and draped Patient monitoring: heart rate, cardiac monitor, continuous pulse ox and blood pressure Approach: midline Location: L4-5 Injection technique: single-shot Needle Needle type: Whitacre  Needle gauge: 25 G Needle length: 9 cm Additional Notes Tolerated procedure well.

## 2016-08-12 NOTE — Progress Notes (Signed)
Dr. Rosita KeaMenz was in to round on pt at approx 1730. Pt has received toradol ivp x1 for pain. Pt stated that he still does not feel better after everything given, is resting quietly in bed with his eyes closed. Of note on admission, pt pointed out a linear scab across his l posterior forearm, and a cut on first finger of his L hand. Wife is at bedside,call bell in reach.

## 2016-08-12 NOTE — Progress Notes (Signed)
Pt c/o nausea after taking meds and po fluids with crackers. Pt has received zofran ivp, when this did not bring relief, pt received reglan ivp. Pt has also received iv morphine for pain. Pt has had no emesis.

## 2016-08-12 NOTE — Progress Notes (Signed)
Pt arrived via bed from pacu at approx 1330. Pt alert and oriented, Pt is on room air, lungs clear bilat, S1S2 heard, pt has bradycardic heart rate of 53, stated he is not dizzy or fatigued,etc. Abdomen is soft, bs heard. Foley draining clear yellow urine, PIV #20 intact to R arm, ivf ns hung at 75/hr per md order. R foot has foot pump on and ted on r leg, pt on arrival is unable to feel touch to his leg, cannot move his toes, moves foot with effort. PPP, cap refill is wnl. L leg has ace wrap in place with polar care, bone foam is placed with explaination for this. L foot is cool, pt cannot feel any touch to his L leg, cannot move foot or toes. Pt is oriented to room and call bell. At time of this writing, pt now feel tingling to his L leg, is able to move R foot and some toes, continued to deny pain. Pt has had no nausea, visitors at bedside. Call bell in reach.

## 2016-08-12 NOTE — Anesthesia Preprocedure Evaluation (Signed)
Anesthesia Evaluation  Patient identified by MRN, date of birth, ID band Patient awake    Reviewed: Allergy & Precautions, NPO status , Patient's Chart, lab work & pertinent test results  History of Anesthesia Complications Negative for: history of anesthetic complications  Airway Mallampati: II       Dental   Pulmonary sleep apnea and Continuous Positive Airway Pressure Ventilation , former smoker,           Cardiovascular hypertension, Pt. on medications      Neuro/Psych    GI/Hepatic Neg liver ROS, GERD (pt denies)  ,  Endo/Other    Renal/GU negative Renal ROS     Musculoskeletal  (+) Arthritis ,   Abdominal   Peds  Hematology negative hematology ROS (+)   Anesthesia Other Findings   Reproductive/Obstetrics                             Anesthesia Physical Anesthesia Plan  ASA: II  Anesthesia Plan: Spinal   Post-op Pain Management:    Induction:   Airway Management Planned:   Additional Equipment:   Intra-op Plan:   Post-operative Plan:   Informed Consent: I have reviewed the patients History and Physical, chart, labs and discussed the procedure including the risks, benefits and alternatives for the proposed anesthesia with the patient or authorized representative who has indicated his/her understanding and acceptance.     Plan Discussed with:   Anesthesia Plan Comments:         Anesthesia Quick Evaluation

## 2016-08-12 NOTE — Care Management Note (Addendum)
Case Management Note  Patient Details  Name: Adam Villa MRN: [id number] Date of Birth: [birth year]  Subjective/Objective:                  Met with patient and his wife to discuss discharge planning. He has a rolling walker, bedside commode and shower chair available at home. I asked wife to bring in walker for PT evaluation. He uses [facility] for medications [phone].  Action/Plan: List of home health agencies left with patient. Lovenox 63m #14 called in to [facility] RNCM to cancel order if patient goes to SNF. RNCM will continue to follow.   Expected Discharge Date:                  Expected Discharge Plan:     In-House Referral:     Discharge planning Services  CM Consult  Post Acute Care Choice:  Home Health Choice offered to:  Patient, Spouse  DME Arranged:    DME Agency:     HH Arranged:  PT HH Agency:     Status of Service:  In process, will continue to follow  If discussed at Long Length of Stay Meetings, dates discussed:    Additional Comments: Lovenox cost $77.85.   AMarshell Garfinkel RN 08/12/2016, 2:21 PM

## 2016-08-12 NOTE — H&P (Signed)
Reviewed paper H+P, will be scanned into chart. No changes noted.  

## 2016-08-12 NOTE — OR Nursing (Signed)
Patient confirmed that the correct side of surgery was to be the left side.  OR and Dr. Rosita KeaMenz informed and all appropriate documents updated.  Underside of Left arm has a 2 inch cut with a scab over it.  Some redness around region and tender to touch.  Patients skin has bruising all over due to the 650 mg of Aspirin that he usually takes.

## 2016-08-12 NOTE — Progress Notes (Signed)
PT Cancellation Note  Patient Details Name: Cline CoolsDavid E Volner MRN: [id number] DOB: [birth year]   Cancelled Treatment:    Reason Eval/Treat Not Completed: Medical issues which prohibited therapy Attempted to see pt at 1630 POD0.  He still had numbness in L LE and though he was able to wiggle toes/ankles is not appropriate to start PT at this time.  Will eval pt tomorrow AM.    Malachi ProGalen R Hasana Alcorta, DPT 08/12/2016, 4:30 PM

## 2016-08-12 NOTE — Transfer of Care (Signed)
Immediate Anesthesia Transfer of Care Note  Patient: Adam Villa  Procedure(s) Performed: Procedure(s): TOTAL KNEE ARTHROPLASTY (Left)  Patient Location: PACU  Anesthesia Type:Spinal  Level of Consciousness: patient cooperative and responds to stimulation  Airway & Oxygen Therapy: Patient Spontanous Breathing and Patient connected to nasal cannula oxygen  Post-op Assessment: Report given to RN and Post -op Vital signs reviewed and stable  Post vital signs: Reviewed and stable  Last Vitals:  Vitals:   08/12/16 1238 08/12/16 1240  BP: (!) 118/53   Pulse: 70 81  Resp: 12 18  Temp: 36.2 C     Last Pain:  Vitals:   08/12/16 0917  TempSrc: Tympanic  PainSc: 7       Patients Stated Pain Goal: 1 (08/12/16 0917)  Complications: No apparent anesthesia complications

## 2016-08-13 ENCOUNTER — Encounter: Payer: Self-pay | Admitting: Orthopedic Surgery

## 2016-08-13 LAB — CBC
HEMATOCRIT: 37.6 % — AB (ref 40.0–52.0)
Hemoglobin: 12.5 g/dL — ABNORMAL LOW (ref 13.0–18.0)
MCH: 30.4 pg (ref 26.0–34.0)
MCHC: 33.3 g/dL (ref 32.0–36.0)
MCV: 91.1 fL (ref 80.0–100.0)
PLATELETS: 198 10*3/uL (ref 150–440)
RBC: 4.13 MIL/uL — ABNORMAL LOW (ref 4.40–5.90)
RDW: 13.6 % (ref 11.5–14.5)
WBC: 7.7 10*3/uL (ref 3.8–10.6)

## 2016-08-13 LAB — BASIC METABOLIC PANEL
ANION GAP: 4 — AB (ref 5–15)
BUN: 13 mg/dL (ref 6–20)
CALCIUM: 8.1 mg/dL — AB (ref 8.9–10.3)
CO2: 27 mmol/L (ref 22–32)
CREATININE: 0.76 mg/dL (ref 0.61–1.24)
Chloride: 105 mmol/L (ref 101–111)
Glucose, Bld: 148 mg/dL — ABNORMAL HIGH (ref 65–99)
Potassium: 3.8 mmol/L (ref 3.5–5.1)
SODIUM: 136 mmol/L (ref 135–145)

## 2016-08-13 MED ORDER — OXYCODONE HCL ER 15 MG PO T12A
15.0000 mg | EXTENDED_RELEASE_TABLET | Freq: Two times a day (BID) | ORAL | Status: DC
  Administered 2016-08-13 – 2016-08-14 (×2): 15 mg via ORAL
  Filled 2016-08-13 (×3): qty 1

## 2016-08-13 MED ORDER — METHYLPREDNISOLONE SODIUM SUCC 40 MG IJ SOLR
40.0000 mg | Freq: Four times a day (QID) | INTRAMUSCULAR | Status: AC
  Administered 2016-08-13 – 2016-08-14 (×2): 40 mg via INTRAVENOUS
  Filled 2016-08-13 (×2): qty 1

## 2016-08-13 NOTE — Evaluation (Signed)
Physical Therapy Evaluation Patient Details Name: Adam Villa MRN: [id number] DOB: Mar 18, 1951 Today's Date: 08/13/2016   History of Present Illness  Pt. is a 65 y.o. male who was admitted for a Left TKR.  Clinical Impression  Pt presents with deficits in strength, transfers, mobility, gait, balance, and activity tolerance.  Pt required min A with sit-to-stand transfers, min A with bed mobility to move LLE, and CGA with amb 3' to chair using RW and step-to pattern secondary to LLE pain with WB.  Pt will benefit from PT services to address above deficits for decreased caregiver assistance upon discharge.      Follow Up Recommendations Home health PT    Equipment Recommendations       Recommendations for Other Services       Precautions / Restrictions Precautions Precautions: Knee;Fall Precaution Booklet Issued: Yes (comment) Required Braces or Orthoses: Knee Immobilizer - Left; bone foam to LLE in while in bed Knee Immobilizer - Left: On when out of bed or walking;Discontinue once straight leg raise with < 10 degree lag Restrictions Weight Bearing Restrictions: Yes LLE Weight Bearing: Weight bearing as tolerated      Mobility  Bed Mobility Overal bed mobility: Needs Assistance Bed Mobility: Rolling;Supine to Sit;Sit to Supine Rolling: Min assist   Supine to sit: Min assist Sit to supine: Min assist      Transfers Overall transfer level: Needs assistance Equipment used: Rolling walker (2 wheeled) Transfers: Sit to/from Stand Sit to Stand: Min assist         General transfer comment: Min verbal cues for sequencing  Ambulation/Gait Ambulation/Gait assistance: Min guard Ambulation Distance (Feet): 3 Feet Assistive device: Rolling walker (2 wheeled) Gait Pattern/deviations: Step-to pattern   Gait velocity interpretation: Below normal speed for age/gender General Gait Details: Antalgic gait on LLE  Stairs Stairs:  (deferred secondary to pain)           Wheelchair Mobility    Modified Rankin (Stroke Patients Only)       Balance Overall balance assessment: Needs assistance   Sitting balance-Leahy Scale: Good     Standing balance support: Bilateral upper extremity supported Standing balance-Leahy Scale: Fair                               Pertinent Vitals/Pain Pain Assessment: 0-10 Pain Score: 6  Pain Descriptors / Indicators: Aching Pain Intervention(s): Premedicated before session    Home Living Family/patient expects to be discharged to:: Private residence Living Arrangements: Spouse/significant other Available Help at Discharge: Family Type of Home: House Home Access: Stairs to enter Entrance Stairs-Rails: Left Entrance Stairs-Number of Steps: 5 Home Layout: One level Home Equipment: Crutches;Walker - 2 wheels      Prior Function Level of Independence: Independent with assistive device(s)               Hand Dominance   Dominant Hand: Right    Extremity/Trunk Assessment   Upper Extremity Assessment: Overall WFL for tasks assessed           Lower Extremity Assessment: Generalized weakness;LLE deficits/detail   LLE Deficits / Details: Pt unable to perform ind LLE SLR; sensation to light touch and proprioception grossly intact     Communication   Communication: No difficulties  Cognition Arousal/Alertness: Awake/alert Behavior During Therapy: WFL for tasks assessed/performed Overall Cognitive Status: Within Functional Limits for tasks assessed  General Comments      Exercises Total Joint Exercises Quad Sets: AROM;Left;10 reps Gluteal Sets: AROM;10 reps Straight Leg Raises: AAROM;Left;5 reps Long Arc Quad: AAROM;Left;10 reps Knee Flexion: AAROM;Left Goniometric ROM: L knee A/AAROM:  flex 63/70, ext -20/-15   Assessment/Plan    PT Assessment Patient needs continued PT services  PT Problem List Decreased strength;Decreased range of  motion;Decreased activity tolerance;Decreased balance;Decreased mobility;Decreased knowledge of use of DME          PT Treatment Interventions DME instruction;Gait training;Stair training;Functional mobility training;Therapeutic activities;Therapeutic exercise;Balance training;Neuromuscular re-education;Patient/family education    PT Goals (Current goals can be found in the Care Plan section)  Acute Rehab PT Goals Patient Stated Goal: To get up and down easier PT Goal Formulation: With patient Time For Goal Achievement: 08/27/16 Potential to Achieve Goals: Good    Frequency BID   Barriers to discharge        Co-evaluation               End of Session Equipment Utilized During Treatment: Gait belt;Right knee immobilizer Activity Tolerance: Patient limited by pain Patient left: in chair;with chair alarm set;with call bell/phone within reach;with family/visitor present;Other (comment) (OT in room)           Time: 1610-9604 PT Time Calculation (min) (ACUTE ONLY): 34 min   Charges:   PT Evaluation $PT Eval Low Complexity: 1 Procedure PT Treatments $Therapeutic Exercise: 8-22 mins   PT G Codes:        DElly Modena PT, DPT 08/13/16, 11:40 AM

## 2016-08-13 NOTE — Evaluation (Signed)
Occupational Therapy Evaluation Patient Details Name: Adam Villa MRN: [id number] DOB: December 06, 1950 Today's Date: 08/13/2016    History of Present Illness Pt. is a 65 y.o. male who was admitted for a Left TKR.   Clinical Impression   Pt. Is a 65 y.o. male who was admitted for a Left TKR. Pt presents with limited ROM, Pain, weakness, and impaired functional mobility which hinder his ability to complete ADL and IADL tasks. Pt. could benefit from skilled OT services to review A/E use for LE ADLs, to review necessary home modifications, and to improve functional mobility for ADL/IADLs in order to work towards regaining Independence with ADL/IADLs. Pt. Plans to return home.       Equipment Recommendations    SNF   Recommendations for Other Services PT services    Precautions / Restrictions Precautions Precautions: Knee;Fall Precaution Booklet Issued: Yes (comment) Required Braces or Orthoses: Knee Immobilizer - Left Knee Immobilizer - Left: On when out of bed or walking;Discontinue once straight leg raise with < 10 degree lag Restrictions Weight Bearing Restrictions: Yes LLE Weight Bearing: Weight bearing as tolerated         Balance Overall balance assessment: Needs assistance   Sitting balance-Leahy Scale: Good     Standing balance support: Bilateral upper extremity supported Standing balance-Leahy Scale: Fair                              ADL Overall ADL's : Needs assistance/impaired Eating/Feeding: Set up   Grooming: Set up           Upper Body Dressing : Set up   Lower Body Dressing: Moderate assistance (With knee immobilizer in place.)               Functional mobility during ADLs: Minimal assistance General ADL Comments: Pt. education was provided about ADL use for LE ADLs.     Vision     Perception     Praxis      Pertinent Vitals/Pain Pain Assessment: 0-10 Pain Score: 8  Pain Descriptors / Aching, sore Pain  Intervention(s): Positioned     Hand Dominance Right   Extremity/Trunk Assessment Upper Extremity Assessment Upper Extremity Assessment: Overall WFL for tasks assessed       Communication Communication Communication: No difficulties   Cognition Arousal/Alertness: Awake/alert Behavior During Therapy: WFL for tasks assessed/performed Overall Cognitive Status: Within Functional Limits for tasks assessed                     General Comments       Exercises       Shoulder Instructions      Home Living Family/patient expects to be discharged to:: Private residence Living Arrangements: Spouse/significant other Available Help at Discharge: Family Type of Home: House Home Access: Stairs to enter Secretary/administrator of Steps: 5 Entrance Stairs-Rails: Left Home Layout: One level     Bathroom Shower/Tub: Walk-in shower;Door     Bathroom Accessibility: Yes   Home Equipment: Crutches;Walker - 2 wheels          Prior Functioning/Environment Level of Independence: Independent with assistive device(s)                 OT Problem List: Decreased strength;Decreased activity tolerance;Pain   OT Treatment/Interventions: Self-care/ADL training;Therapeutic exercise;Patient/family education;Therapeutic activities    OT Goals(Current goals can be found in the care plan section) Acute Rehab OT Goals Patient Stated Goal: To get  up and down easier OT Goal Formulation: With patient Potential to Achieve Goals: Good  OT Frequency: Min 1X/week   Barriers to D/C:            Co-evaluation              End of Session Equipment Utilized During Treatment: Left knee immobilizer  Activity Tolerance: Patient tolerated treatment well Patient left: in bed;with call bell/phone within reach;with bed alarm set   Time: 1003-1035 OT Time Calculation (min): 32 min Charges:  OT General Charges $OT Visit: 1 Procedure OT Evaluation $OT Eval Moderate Complexity: 1  Procedure OT Treatments $Self Care/Home Management : 8-22 mins G-Codes:    Adam MessierElaine Quint Chestnut, MS, OTR/L 08/13/2016, 11:38 AM

## 2016-08-13 NOTE — Progress Notes (Signed)
Physical Therapy Treatment Patient Details Name: Adam Villa MRN: [id number] DOB: 1951/07/25 Today's Date: 08/13/2016    History of Present Illness Pt. is a 65 y.o. male who was admitted for a Left TKR.    PT Comments    Pt presented with decreased assistance with bed mobility this session with SBA for rolling and sup-to-sit and min A with sit-to-sup.  Amb distance increased to 1 x 20' and 1 x 10' with CGA and RW but continues to be limited by pain in L knee which was 6/10 at baseline on pain medication and 8/10 after amb.  Step training deferred secondary to L knee pain after amb and pt requesting returning to bed.  Pt will benefit from PT services to address deficits in strength, gait, mobility, balance, L knee ROM, and activity tolerance for decreased caregiver assistance upon discharge.   Follow Up Recommendations  Home health PT     Equipment Recommendations  None recommended by PT    Recommendations for Other Services       Precautions / Restrictions Precautions Precautions: Fall;Knee Precaution Booklet Issued: Yes (comment) Required Braces or Orthoses: Knee Immobilizer - Left Knee Immobilizer - Left: On when out of bed or walking;Discontinue once straight leg raise with < 10 degree lag Restrictions Weight Bearing Restrictions: Yes LLE Weight Bearing: Weight bearing as tolerated    Mobility  Bed Mobility Overal bed mobility: Needs Assistance Bed Mobility: Rolling;Supine to Sit;Sit to Supine Rolling: Supervision   Supine to sit: Supervision Sit to supine: Min assist      Transfers Overall transfer level: Needs assistance Equipment used: Rolling walker (2 wheeled) Transfers: Sit to/from Stand Sit to Stand: Min guard         General transfer comment: Min verbal cues for sequencing from elevated EOB  Ambulation/Gait Ambulation/Gait assistance: Min guard Ambulation Distance (Feet): 20 Feet Assistive device: Rolling walker (2 wheeled) Gait  Pattern/deviations: Step-to pattern   Gait velocity interpretation: Below normal speed for age/gender General Gait Details: Antalgic gait on LLE   Stairs Stairs:  (deferred secondary to L knee pain)          Wheelchair Mobility    Modified Rankin (Stroke Patients Only)       Balance Overall balance assessment: Needs assistance Sitting-balance support: No upper extremity supported Sitting balance-Leahy Scale: Good     Standing balance support: Bilateral upper extremity supported Standing balance-Leahy Scale: Fair                      Cognition Arousal/Alertness: Awake/alert Behavior During Therapy: WFL for tasks assessed/performed Overall Cognitive Status: Within Functional Limits for tasks assessed                      Exercises Total Joint Exercises Ankle Circles/Pumps: AROM;Both;10 reps Quad Sets: AROM;Left;10 reps;15 reps Gluteal Sets: AROM;10 reps Heel Slides: AROM;Right;10 reps Straight Leg Raises: AAROM;Left;5 reps Long Arc Quad: AROM;Left;10 reps Knee Flexion: AROM;Left;10 reps;Other reps (comment) (2 x 10 reps) Marching in Standing: AROM;10 reps Other Exercises Other Exercises: HEP education/review per handout    General Comments        Pertinent Vitals/Pain Pain Assessment: 0-10 Pain Score: 8  Pain Location: L knee and L quad cramping Pain Descriptors / Indicators: Aching;Cramping Pain Intervention(s): Limited activity within patient's tolerance;Monitored during session;Premedicated before session (polar care donned at end of session)    Home Living  Prior Function            PT Goals (current goals can now be found in the care plan section) Progress towards PT goals: Progressing toward goals    Frequency    BID      PT Plan Current plan remains appropriate    Co-evaluation             End of Session Equipment Utilized During Treatment: Gait belt;Left knee immobilizer Activity  Tolerance: Patient limited by pain Patient left: in bed;with bed alarm set;with SCD's reapplied;with family/visitor present;with call bell/phone within reach (polar care donned)     Time: 1610-9604 PT Time Calculation (min) (ACUTE ONLY): 39 min  Charges:  $Gait Training: 8-22 mins $Therapeutic Exercise: 8-22 mins $Therapeutic Activity: 8-22 mins                    G Codes:      DElly Modena PT, DPT 08/13/16, 4:01 PM

## 2016-08-13 NOTE — Care Management (Signed)
Home health has been arranged with [facility] after speaking with patient and his wife. Wife is aware and agrees of cost for Lovenox $77.85.

## 2016-08-13 NOTE — Progress Notes (Signed)
CSW received consult for possible SNF placement. CSW is awaiting PT evaluation to be completed to determine the appropriate level of care needed for patient. CSW will continue to follow and assist.  Rozella Servello, MSW, LCSW, LCAS-A Clinical Social Worker [phone]   

## 2016-08-13 NOTE — Progress Notes (Signed)
PT recommenced HH. RNCM is aware. CSW is signing off but is available if a CSW need were to arise.  Karion Cudd, MSW, LCSW, LCAS-A Clinical Social Worker [phone]  

## 2016-08-13 NOTE — Progress Notes (Signed)
   Subjective: 1 Day Post-Op Procedure(s) (LRB): TOTAL KNEE ARTHROPLASTY (Left) Patient reports pain as mild.   Patient is well, but has had some minor complaints of nausea and vomiting yesterday, this has resolved. Denies any CP, SOB, ABD pain. We will continue therapy today.  Plan is to go Home after hospital stay.  Objective: Vital signs in last 24 hours: Temp:  [96.9 F (36.1 C)-98.2 F (36.8 C)] 98.2 F (36.8 C) (10/18 0432) Pulse Rate:  [48-88] 82 (10/18 0433) Resp:  [12-21] 19 (10/18 0432) BP: (97-159)/(49-80) 110/50 (10/18 0437) SpO2:  [95 %-100 %] 98 % (10/18 0432) FiO2 (%):  [21 %] 21 % (10/17 1359) Weight:  [88 kg (194 lb)] 88 kg (194 lb) (10/17 0917)  Intake/Output from previous day: 10/17 0701 - 10/18 0700 In: 1670 [P.O.:120; I.V.:1550] Out: 1990 [Urine:1790; Blood:200] Intake/Output this shift: No intake/output data recorded.   Recent Labs  08/12/16 1413 08/13/16 0334  HGB 13.0 12.5*    Recent Labs  08/12/16 1413 08/13/16 0334  WBC 6.2 7.7  RBC 4.31* 4.13*  HCT 38.3* 37.6*  PLT 202 198    Recent Labs  08/12/16 1413 08/13/16 0334  NA  --  136  K  --  3.8  CL  --  105  CO2  --  27  BUN  --  13  CREATININE 0.82 0.76  GLUCOSE  --  148*  CALCIUM  --  8.1*   No results for input(s): LABPT, INR in the last 72 hours.  EXAM General - Patient is Alert, Appropriate and Oriented Extremity - Neurovascular intact Sensation intact distally Intact pulses distally Dorsiflexion/Plantar flexion intact No cellulitis present Compartment soft Dressing - dressing C/D/I and no drainage Motor Function - intact, moving foot and toes well on exam.   Past Medical History:  Diagnosis Date  . Arthritis   . Elevated lipids   . GERD (gastroesophageal reflux disease)   . Hypertension   . Mini stroke (HCC)    POSSIBLE-PT WAS UNSURE IF IT WAS A MINI-STROKE OR AN INFECTION  . Sleep apnea    uses cpap  . Torn rotator cuff    left    Assessment/Plan:    1 Day Post-Op Procedure(s) (LRB): TOTAL KNEE ARTHROPLASTY (Left) Active Problems:   Primary localized osteoarthritis of left knee  Estimated body mass index is 27.84 kg/m as calculated from the following:   Height as of this encounter: 5\' 10"  (1.778 m).   Weight as of this encounter: 88 kg (194 lb). Advance diet Up with therapy  Needs BM Acute post op blood loss anemia - recheck labs in the am CM to assist with discharge  DVT Prophylaxis - Lovenox, Foot Pumps and TED hose Weight-Bearing as tolerated to left leg   T. Cranston Neighborhris Larosa Rhines, PA-C [facility] 08/13/2016, 7:22 AM

## 2016-08-13 NOTE — Anesthesia Postprocedure Evaluation (Signed)
Anesthesia Post Note  Patient: Adam Villa  Procedure(s) Performed: Procedure(s) (LRB): TOTAL KNEE ARTHROPLASTY (Left)  Patient location during evaluation: Nursing Unit Anesthesia Type: Spinal Level of consciousness: awake and alert, oriented and patient cooperative Pain management: satisfactory to patient Vital Signs Assessment: post-procedure vital signs reviewed and stable Respiratory status: respiratory function stable and nonlabored ventilation Cardiovascular status: stable Postop Assessment: no signs of nausea or vomiting and adequate PO intake Anesthetic complications: no    Last Vitals:  Vitals:   08/13/16 0433 08/13/16 0437  BP: (!) 97/49 (!) 110/50  Pulse: 82   Resp:    Temp:      Last Pain:  Vitals:   08/13/16 0432  TempSrc: Oral  PainSc:                  Lenward ChancellorSavage,  Yanitza Shvartsman A

## 2016-08-14 LAB — CBC
HEMATOCRIT: 41.6 % (ref 40.0–52.0)
HEMOGLOBIN: 14.1 g/dL (ref 13.0–18.0)
MCH: 29.9 pg (ref 26.0–34.0)
MCHC: 33.9 g/dL (ref 32.0–36.0)
MCV: 88.3 fL (ref 80.0–100.0)
Platelets: 217 10*3/uL (ref 150–440)
RBC: 4.72 MIL/uL (ref 4.40–5.90)
RDW: 13.9 % (ref 11.5–14.5)
WBC: 11.5 10*3/uL — ABNORMAL HIGH (ref 3.8–10.6)

## 2016-08-14 LAB — BASIC METABOLIC PANEL
ANION GAP: 7 (ref 5–15)
BUN: 11 mg/dL (ref 6–20)
CHLORIDE: 104 mmol/L (ref 101–111)
CO2: 26 mmol/L (ref 22–32)
CREATININE: 0.81 mg/dL (ref 0.61–1.24)
Calcium: 8.7 mg/dL — ABNORMAL LOW (ref 8.9–10.3)
GFR calc non Af Amer: >60 mL/min (ref >60)
Glucose, Bld: 155 mg/dL — ABNORMAL HIGH (ref 65–99)
POTASSIUM: 3.9 mmol/L (ref 3.5–5.1)
SODIUM: 137 mmol/L (ref 135–145)

## 2016-08-14 MED ORDER — METHOCARBAMOL 500 MG PO TABS: 500.0000 mg | ORAL_TABLET | Freq: Three times a day (TID) | ORAL | 0 refills | Status: DC | PRN

## 2016-08-14 MED ORDER — LACTULOSE 10 GM/15ML PO SOLN
10.0000 g | Freq: Two times a day (BID) | ORAL | Status: DC
  Administered 2016-08-14: 10 g via ORAL
  Filled 2016-08-14: qty 30

## 2016-08-14 MED ORDER — OXYCODONE HCL 5 MG PO TABS: 5.0000 mg | ORAL_TABLET | ORAL | 0 refills | Status: DC | PRN

## 2016-08-14 MED ORDER — OXYCODONE HCL ER 15 MG PO T12A: 15.0000 mg | EXTENDED_RELEASE_TABLET | Freq: Two times a day (BID) | ORAL | 0 refills | Status: DC

## 2016-08-14 MED ORDER — ENOXAPARIN SODIUM 40 MG/0.4ML ~~LOC~~ SOLN: 40.0000 mg | SUBCUTANEOUS | 0 refills | Status: DC

## 2016-08-14 NOTE — Progress Notes (Signed)
Patient was discharged home via wheelchair and his wife. Had a small BM, ABD soft and round, passing a lot of flatus, and patient doesn't feel "bloated". Wife and patient feel comfortable going home. IV removed with cath intact. Reviewed Lovenox kit and discharge instructions. Along with meds, scripts, and last dose given.

## 2016-08-14 NOTE — Progress Notes (Signed)
   Subjective: 2 Days Post-Op Procedure(s) (LRB): TOTAL KNEE ARTHROPLASTY (Left) Patient reports pain as mild.   Patient is well, and has had no acute complaints or problems. Muscle spasms last night improved. Denies any CP, SOB, ABD pain. We will continue therapy today.  Plan is to go Home after hospital stay.  Objective: Vital signs in last 24 hours: Temp:  [97.6 F (36.4 C)-98.3 F (36.8 C)] 98.2 F (36.8 C) (10/19 0332) Pulse Rate:  [68-74] 68 (10/19 0332) Resp:  [16-18] 16 (10/19 0332) BP: (121-143)/(61-74) 143/74 (10/19 0332) SpO2:  [95 %-97 %] 95 % (10/19 0332)  Intake/Output from previous day: 10/18 0701 - 10/19 0700 In: 2130 [P.O.:720; I.V.:1410] Out: 4425 [Urine:4425] Intake/Output this shift: No intake/output data recorded.   Recent Labs  08/12/16 1413 08/13/16 0334 08/14/16 0607  HGB 13.0 12.5* 14.1    Recent Labs  08/13/16 0334 08/14/16 0607  WBC 7.7 11.5*  RBC 4.13* 4.72  HCT 37.6* 41.6  PLT 198 217    Recent Labs  08/13/16 0334 08/14/16 0607  NA 136 137  K 3.8 3.9  CL 105 104  CO2 27 26  BUN 13 11  CREATININE 0.76 0.81  GLUCOSE 148* 155*  CALCIUM 8.1* 8.7*   No results for input(s): LABPT, INR in the last 72 hours.  EXAM General - Patient is Alert, Appropriate and Oriented Extremity - Neurovascular intact Sensation intact distally Intact pulses distally Dorsiflexion/Plantar flexion intact No cellulitis present Compartment soft Dressing - dressing C/D/I and no drainage Motor Function - intact, moving foot and toes well on exam.   Past Medical History:  Diagnosis Date  . Arthritis   . Elevated lipids   . GERD (gastroesophageal reflux disease)   . Hypertension   . Mini stroke (HCC)    POSSIBLE-PT WAS UNSURE IF IT WAS A MINI-STROKE OR AN INFECTION  . Sleep apnea    uses cpap  . Torn rotator cuff    left    Assessment/Plan:   2 Days Post-Op Procedure(s) (LRB): TOTAL KNEE ARTHROPLASTY (Left) Active Problems:   Primary  localized osteoarthritis of left knee  Estimated body mass index is 27.84 kg/m as calculated from the following:   Height as of this encounter: 5\' 10"  (1.778 m).   Weight as of this encounter: 88 kg (194 lb). Advance diet Up with therapy  Needs BM Plan on discharge to home with HHPT tomorrow  DVT Prophylaxis - Lovenox, Foot Pumps and TED hose Weight-Bearing as tolerated to left leg   T. Cranston Neighborhris Gaines, PA-C [facility] 08/14/2016, 7:32 AM

## 2016-08-14 NOTE — Discharge Summary (Signed)
Physician Discharge Summary  Patient ID: Adam Villa MRN: [id number] DOB/AGE: Sep 08, 1951 65 y.o.  Admit date: 08/12/2016 Discharge date: 08/14/2016  Admission Diagnoses:  PRIMARY OSTEOARTHRITIS   Discharge Diagnoses: Patient Active Problem List   Diagnosis Date Noted  . Primary localized osteoarthritis of left knee 08/12/2016    Past Medical History:  Diagnosis Date  . Arthritis   . Elevated lipids   . GERD (gastroesophageal reflux disease)   . Hypertension   . Mini stroke (HCC)    POSSIBLE-PT WAS UNSURE IF IT WAS A MINI-STROKE OR AN INFECTION  . Sleep apnea    uses cpap  . Torn rotator cuff    left     Transfusion: none   Consultants (if any):   Discharged Condition: Improved  Hospital Course: Adam CoolsDavid E Laskowski is an 65 y.o. male who was admitted 08/12/2016 with a diagnosis of left knee osteoarthritis and went to the operating room on 08/12/2016 and underwent the above named procedures.    Surgeries: Procedure(s): TOTAL KNEE ARTHROPLASTY on 08/12/2016 Patient tolerated the surgery well. Taken to PACU where she was stabilized and then transferred to the orthopedic floor.  Started on Lovenox 30 q 12 hrs. Foot pumps applied bilaterally at 80 mm. Heels elevated on bed with rolled towels. No evidence of DVT. Negative Homan. Physical therapy started on day #1 for gait training and transfer. OT started day #1 for ADL and assisted devices.  Patient's foley was d/c on day #1. Patient's IV was d/c on day #2.  On post op day #2 patient was stable and ready for discharge to home with HHPT.  Implants: Medacta GMK sphere left 4 femur, 4 tibia with 10 mm insert, 3 patella, short stem on tibia, all components cemented  He was given perioperative antibiotics:  Anti-infectives    Start     Dose/Rate Route Frequency Ordered Stop   08/12/16 1500  clindamycin (CLEOCIN) IVPB 900 mg     900 mg 100 mL/hr over 30 Minutes Intravenous Every 6 hours 08/12/16 1358 08/13/16 0319   08/12/16  0330  clindamycin (CLEOCIN) IVPB 900 mg     900 mg 100 mL/hr over 30 Minutes Intravenous  Once 08/12/16 0317 08/12/16 1059    .  He was given sequential compression devices, early ambulation, and lovenox for DVT prophylaxis.  He benefited maximally from the hospital stay and there were no complications.    Recent vital signs:  Vitals:   08/13/16 1949 08/14/16 0332  BP: (!) 143/70 (!) 143/74  Pulse: 73 68  Resp: 18 16  Temp: 98.3 F (36.8 C) 98.2 F (36.8 C)    Recent laboratory studies:  Lab Results  Component Value Date   HGB 14.1 08/14/2016   HGB 12.5 (L) 08/13/2016   HGB 13.0 08/12/2016   Lab Results  Component Value Date   WBC 11.5 (H) 08/14/2016   PLT 217 08/14/2016   Lab Results  Component Value Date   INR 1.00 07/30/2016   Lab Results  Component Value Date   NA 137 08/14/2016   K 3.9 08/14/2016   CL 104 08/14/2016   CO2 26 08/14/2016   BUN 11 08/14/2016   CREATININE 0.81 08/14/2016   GLUCOSE 155 (H) 08/14/2016    Discharge Medications:     Medication List    TAKE these medications   amLODipine 10 MG tablet Commonly known as:  NORVASC Take 10 mg by mouth at bedtime.   aspirin 325 MG tablet Take 650 mg by mouth daily.  atorvastatin 40 MG tablet Commonly known as:  LIPITOR Take 40 mg by mouth every morning.   enoxaparin 40 MG/0.4ML injection Commonly known as:  LOVENOX Inject 0.4 mLs (40 mg total) into the skin daily.   Fish Oil Oil 1 capsule by Does not apply route daily.   methocarbamol 500 MG tablet Commonly known as:  ROBAXIN Take 1 tablet (500 mg total) by mouth every 8 (eight) hours as needed for muscle spasms.   oxyCODONE 15 mg 12 hr tablet Commonly known as:  OXYCONTIN Take 1 tablet (15 mg total) by mouth every 12 (twelve) hours.   oxyCODONE 5 MG immediate release tablet Commonly known as:  Oxy IR/ROXICODONE Take 1-2 tablets (5-10 mg total) by mouth every 4 (four) hours as needed for breakthrough pain.   predniSONE 1 MG  tablet Commonly known as:  DELTASONE Take 2 mg by mouth daily with breakfast.   Vitamin D3 2000 units Tabs Take 1 tablet by mouth daily.       Diagnostic Studies: Dg Knee 1-2 Views Left  Result Date: 08/12/2016 CLINICAL DATA:  Total knee arthroplasty EXAM: LEFT KNEE - 1-2 VIEW COMPARISON:  CT 07/14/2016 FINDINGS: Total knee arthroplasty without periprosthetic fracture. Prostheses appear well seated. Expected soft tissue gas and swelling. Atherosclerotic calcification. IMPRESSION: No acute finding after total knee arthroplasty. Electronically Signed   By: Marnee Spring M.D.   On: 08/12/2016 12:59    Disposition: 01-Home or Self Care    Follow-up Information    MENZ,MICHAEL, MD Follow up in 2 week(s).   Specialty:  Orthopedic Surgery Contact information: [address] [place] [address] [phone]            Signed: Amador Cunas [facility] 08/14/2016, 12:07 PM

## 2016-08-14 NOTE — Discharge Instructions (Signed)

## 2016-08-14 NOTE — Progress Notes (Signed)
Physical Therapy Treatment Patient Details Name: Adam Villa MRN: [id number] DOB: 08/07/1951 Today's Date: 08/14/2016    History of Present Illness 65 y.o. male who s/p Left TKR 10/17.    PT Comments    Pt has a much better session today with increased strength, ROM, ambulation tolerance, ability to do steps and overall less pain and hesitancy.  He was able to walk ~250 and easily negotiated up/down steps.  Pt with AROM SLRs and though he is still missing a few degrees of terminal knee extension had increased quality of motion and tolerance of PROM/stretch activities.  Pt did well walking w/o KI and showed no buckling and better cadence and tolerance than previous bouts of ambulation.   Follow Up Recommendations  Home health PT     Equipment Recommendations  None recommended by PT    Recommendations for Other Services       Precautions / Restrictions Precautions Precautions: Fall;Knee Restrictions Weight Bearing Restrictions: Yes LLE Weight Bearing: Weight bearing as tolerated    Mobility  Bed Mobility Overal bed mobility: Modified Independent Bed Mobility: Supine to Sit Rolling: Supervision         General bed mobility comments: Pt able to get himself to sitting EOB w/o excessive use of the rails and no direct PT assist  Transfers Overall transfer level: Modified independent Equipment used: Rolling walker (2 wheeled) Transfers: Sit to/from Stand Sit to Stand: Supervision         General transfer comment: cues for hand placement and set up, overall able to rise  Ambulation/Gait Ambulation/Gait assistance: Supervision Ambulation Distance (Feet): 250 Feet Assistive device: Rolling walker (2 wheeled)       General Gait Details: Pt is able to ambulate considerably better today.  He initially very hesitant with L WBing but after some cuing he showed significant increase in speed, cadence, decreased UE reliance and generally looked much, much more confident.      Stairs Stairs: Yes Stairs assistance: Supervision Stair Management: One rail Left Number of Stairs: 4 General stair comments: Pt able to easily negotiate up/down steps with minimal cuing and no safety concerns  Wheelchair Mobility    Modified Rankin (Stroke Patients Only)       Balance Overall balance assessment: Modified Independent                                  Cognition Arousal/Alertness: Awake/alert Behavior During Therapy: WFL for tasks assessed/performed Overall Cognitive Status: Within Functional Limits for tasks assessed                      Exercises Total Joint Exercises Ankle Circles/Pumps: Strengthening;15 reps Quad Sets: Strengthening;15 reps Gluteal Sets: Strengthening;15 reps Heel Slides: Strengthening;15 reps Straight Leg Raises: AROM;AAROM;5 reps (pt able to do the last 6 reps w/o any assist) Knee Flexion: PROM;5 reps Goniometric ROM: 3-81    General Comments        Pertinent Vitals/Pain Pain Assessment: 0-10 Pain Score: 2  (increases with ROM and exercises)    Home Living                      Prior Function            PT Goals (current goals can now be found in the care plan section) Progress towards PT goals: Progressing toward goals    Frequency    BID  PT Plan Current plan remains appropriate    Co-evaluation             End of Session Equipment Utilized During Treatment: Gait belt Activity Tolerance: Patient limited by pain Patient left: with chair alarm set;with call bell/phone within reach;with family/visitor present     Time: [id number] PT Time Calculation (min) (ACUTE ONLY): 56 min  Charges:  $Gait Training: 23-37 mins $Therapeutic Exercise: 23-37 mins                    G Codes:      Malachi ProGalen R Melville Engen, DPT 08/14/2016, 12:12 PM

## 2016-08-14 NOTE — Care Management (Signed)
Patient is discharging to home today. [facility] discharge. No further RNCM needs.

## 2016-08-17 NOTE — H&P (Signed)
Progress Notes - in this encounter  Table of Contents for Progress Notes Adam Villa, [place] - 07/24/2016 3:00 PM EDT Adam Villa, CMA - 07/24/2016 3:00 PM EDT   Adam Musca, PA - 07/24/2016 3:00 PM EDT Formatting of this note may be different from the original. Chief Complaint: Chief Complaint  Patient presents with  . Knee Pain   Adam Villa is a 65 y.o. male who presents today for evaluation of bilateral knee pain. Patient's had bilateral knee pain for years. Last seen 05/2016, received aspiration and injection of cortisone which gave him 3 weeks worth relief. Immediately after the injections, patient went to the beach, performed a lot of walking, more than usual. Patient has had significant tightness and swelling throughout both knees, right greater than left. His x-rays showed bone-on-bone in the medial compartment of both knees. He denies any warmth or fevers. Pain is 9 out of 10 right greater than left. He has had no relief with ibuprofen. Patient has tried Visco supplementation in the past with no relief. Patient is considering total knee arthroplasty, would like to wait until later this year.  Past Medical History: Past Medical History:  Diagnosis Date  . Hearing loss  right  . Hyperglycemia  . Hypertension  . Osteoarthritis  Bilateral knees  . PMR (polymyalgia rheumatica) (CMS-HCC)  . Sleep apnea  questionable  . Stroke (CMS-HCC)   Past Surgical History: Past Surgical History:  Procedure Laterality Date  . BACK SURGERY  . extendive arthroscopic debridement, arthroscopic decompression, mini-open rotator cuff repair, and mini-open biceps tenodesis, left shoulder Left 08/30/2015  Dr.Poggi  . KNEE ARTHROSCOPY Right 10/23/2015  . Left Knee arthroscopic medial meniscectomy, excision of plica band. 03-01-2010  . Lumbar spine surgery  . R Knee Arthroscopy 10/23/15 Right 10/23/2015  Dr Kennedy Bucker  . Right ear surgery  . TONSILLECTOMY    Past Family History: Family History  Problem Relation Age of Onset  . Diabetes type II Mother  . Diabetes type II Father  . Lymphoma Son  . Arthritis Other   Medications: Current Outpatient Prescriptions Ordered in Epic  Medication Sig Dispense Refill  . amLODIPine (NORVASC) 10 MG tablet TAKE 1 TABLET BY MOUTH EVERY DAY 90 tablet 3  . aspirin 325 MG EC tablet Take 650 mg by mouth once daily.  Marland Kitchen atorvastatin (LIPITOR) 40 MG tablet TAKE 1 TABLET(40 MG) BY MOUTH EVERY DAY 90 tablet 0  . cholecalciferol (VITAMIN D3) 2,000 unit tablet Take 2,000 Units by mouth once daily.   . lansoprazole (PREVACID) 30 MG DR capsule TAKE 1 CAPSULE BY MOUTH EVERY DAY 90 capsule 0  . omega-3 fatty acids-vitamin E (FISH OIL) 1,000 mg Use 1 capsule once daily.   . OMEGA-3/DHA/EPA/FISH OIL (FISH OIL-OMEGA-3 FATTY ACIDS) 300-1,000 mg capsule Take 2 g by mouth once daily.  . predniSONE (DELTASONE) 1 MG tablet Take 2 tablets (2 mg total) by mouth as directed. 60 tablet 1   No current Epic-ordered facility-administered medications on file.   Allergies: Allergies  Allergen Reactions  . Penicillins Rash    Review of Systems:  A comprehensive 14 point ROS was performed, reviewed by me today, and the pertinent orthopaedic findings are documented in the HPI.  Exam: BP 120/70 (BP Location: Left upper arm, Patient Position: Sitting)  Ht 172.7 cm (5\' 8" )  Wt 87 kg (191 lb 12.8 oz)  BMI 29.16 kg/m2  General: Well developed, well nourished 65 y.o. male in no apparent distress. Normal affect. Normal communication.  Patient answers questions appropriately. The patient has a normal gait. There is no antalgic component. There is no hip lurch.   Left and right lower Extremities: Examination of the left and right lower extremity reveals no bony abnormality, no edema, right greater than left moderate to severe effusion and no ecchymosis. There is no valgus or varus abnormality. The patient is non-tender along the  lateral joint line, and is non-tender along the medial joint line. The patient has 0-90 range of motion. There is moderate discomfort, tightness with flexion. There is no retropatellar discomfort. The patient has a negative patella stretch test. The patient has a negative varus stress test and a negative valgus stress test, in looking for stability. The patient has a negative Lachman's test.  Vascular: The patient has a negative Denna HaggardHomans' test bilaterally. The patient had a normal dorsalis pedis and posterior tibial pulse. There is normal skin warmth. There is normal capillary refill bilaterally.   Neurologic: The patient has a negative straight leg raise. The patient has normal muscle strength testing for the quadriceps, calves, ankle dorsiflexion, ankle plantarflexion, and extensor hallicus longus. The patient has sensation that is intact to light touch. The deep tendon reflexes are normal at the patella and achilles. No clonus is noted.   Imaging: X-rays reviewed by me from 04/23/2016 show severe degenerative changes of bone-on-bone in the medial compartment bilateral knees  Impression: Primary osteoarthritis of both knees [M17.0] Primary osteoarthritis of both knees (primary encounter diagnosis) Effusion of both knee joints  Plan:  1. Patient agreed and consented to bilateral knee aspirations. She is scheduled for total knee arthroplasty in the next few weeks. Patient will rest ice and elevate the knees and apply compression dressings.  Left Knee Joint Aspiration Procedure: Consent  After discussing the various treatment options for the condition, It was agreed that a knee aspiration would be performed. Risks benefits complications were discussed with the patient.  Procedure  After the risks and benefits of the procedure were explained, consent was given, and time-out was performed. The site for the injection was properly marked and prepped with Betadine followed by alcohol. .  The  superiorlateral knee injection site was anesthetized with ethyl chloride. The left knee was injected with an arthrocentesis procedure, using 3 cc's 1 % lidocaine. Skin was then cleansed again with alcohol and a 18 gauge needle was inserted into the superiorlateral physician, a 60 mL syringe was used aspirate fluid from the knee. 60ML of normal synovial fluid was aspirated from the knee.  Patient tolerated the procedure well. A Band-Aid was applied.  Right Knee Joint Aspiration Procedure: Consent  After discussing the various treatment options for the condition, It was agreed that a knee aspiration would be performed. Risks benefits complications were discussed with the patient.  Procedure  After the risks and benefits of the procedure were explained, consent was given, and time-out was performed. The site for the injection was properly marked and prepped with Betadine followed by alcohol. .  The superiorlateral knee injection site was anesthetized with ethyl chloride. The right knee was injected with an arthrocentesis procedure, using 3 cc's 1 % lidocaine. Skin was then cleansed again with alcohol and a 18 gauge needle was inserted into the superiorlateral physician, a 60 mL syringe was used aspirate fluid from the knee. 30 ML of normal synovial fluid was aspirated from the knee.  Patient tolerated the procedure well. A Band-Aid was applied.  This note was generated in part with voice recognition software and  I apologize for any typographical errors that were not detected and corrected.  Adam Villa MPA-C

## 2016-09-25 ENCOUNTER — Encounter
Admission: RE | Admit: 2016-09-25 | Discharge: 2016-09-25 | Disposition: A | Discharge status: Home / Self Care | Payer: Medicare Other | Source: Ambulatory Visit | Attending: Orthopedic Surgery | Admitting: Orthopedic Surgery

## 2016-09-25 DIAGNOSIS — M170 Bilateral primary osteoarthritis of knee: Secondary | ICD-10-CM | POA: Diagnosis not present

## 2016-09-25 DIAGNOSIS — Z0183 Encounter for blood typing: Secondary | ICD-10-CM | POA: Insufficient documentation

## 2016-09-25 DIAGNOSIS — Z01812 Encounter for preprocedural laboratory examination: Secondary | ICD-10-CM | POA: Diagnosis present

## 2016-09-25 HISTORY — DX: Cerebral infarction, unspecified: I63.9

## 2016-09-25 LAB — URINALYSIS COMPLETE WITH MICROSCOPIC (ARMC ONLY)
Bacteria, UA: NONE SEEN
Bilirubin Urine: NEGATIVE
GLUCOSE, UA: 150 mg/dL — AB
KETONES UR: NEGATIVE mg/dL
Leukocytes, UA: NEGATIVE
NITRITE: NEGATIVE
Protein, ur: 30 mg/dL — AB
SPECIFIC GRAVITY, URINE: 1.021 (ref 1.005–1.030)
pH: 5 (ref 5.0–8.0)

## 2016-09-25 LAB — BASIC METABOLIC PANEL
ANION GAP: 5 (ref 5–15)
BUN: 18 mg/dL (ref 6–20)
CHLORIDE: 103 mmol/L (ref 101–111)
CO2: 30 mmol/L (ref 22–32)
Calcium: 9.4 mg/dL (ref 8.9–10.3)
Creatinine, Ser: 0.86 mg/dL (ref 0.61–1.24)
GFR calc Af Amer: >60 mL/min (ref >60)
GFR calc non Af Amer: >60 mL/min (ref >60)
GLUCOSE: 96 mg/dL (ref 65–99)
POTASSIUM: 4 mmol/L (ref 3.5–5.1)
SODIUM: 138 mmol/L (ref 135–145)

## 2016-09-25 LAB — PROTIME-INR
INR: 0.92
PROTHROMBIN TIME: 12.4 s (ref 11.4–15.2)

## 2016-09-25 LAB — TYPE AND SCREEN
ABO/RH(D): A POS
ANTIBODY SCREEN: NEGATIVE

## 2016-09-25 LAB — CBC
HCT: 41.1 % (ref 40.0–52.0)
HEMOGLOBIN: 13.9 g/dL (ref 13.0–18.0)
MCH: 29.7 pg (ref 26.0–34.0)
MCHC: 33.9 g/dL (ref 32.0–36.0)
MCV: 87.7 fL (ref 80.0–100.0)
Platelets: 227 10*3/uL (ref 150–440)
RBC: 4.68 MIL/uL (ref 4.40–5.90)
RDW: 14.6 % — ABNORMAL HIGH (ref 11.5–14.5)
WBC: 7.2 10*3/uL (ref 3.8–10.6)

## 2016-09-25 LAB — SEDIMENTATION RATE: SED RATE: 12 mm/h (ref 0–20)

## 2016-09-25 LAB — SURGICAL PCR SCREEN
MRSA, PCR: NEGATIVE
Staphylococcus aureus: NEGATIVE

## 2016-09-25 LAB — APTT: APTT: 26 s (ref 24–36)

## 2016-09-25 NOTE — Pre-Procedure Instructions (Signed)
[facility] lab samples obtained this am.  Some of the identifying numbers were cut off on the printed labels on the blood samples and will need to be redrawn. Verified that the labels on the urine samples, nasal swab and Tupe and Screen are fine, the rest of the blood work needs to be redrawn.  Spoke with pt's wife regarding the need to redraw some blood.  She will contact pt with in information.  Pt called and is coming back this am for blood redraw.

## 2016-09-25 NOTE — Patient Instructions (Signed)
  Your procedure is scheduled YN:WGNFAOZon:Tuesday Dec. 12, 2017 , 2017. Report to Same Day Surgery. To find out your arrival time please call [phone] between 1PM - 3PM on Monday Dec. 11, 2017.  Remember: Instructions that are not followed completely may result in serious medical risk, up to and including death, or upon the discretion of your surgeon and anesthesiologist your surgery may need to be rescheduled.    _x___ 1. Do not eat food or drink liquids after midnight. No gum chewing or hard candies.     _x___ 2. No Alcohol for 24 hours before or after surgery.   ____ 3. Bring all medications with you on the day of surgery if instructed.    __x__ 4. Notify your doctor if there is any change in your medical condition     (cold, fever, infections).    _____ 5. No smoking 24 hours prior to surgery.     Do not wear jewelry, make-up, hairpins, clips or nail polish.  Do not wear lotions, powders, or perfumes.   Do not shave 48 hours prior to surgery. Men may shave face and neck.  Do not bring valuables to the hospital.    [facility] is not responsible for any belongings or valuables.               Contacts, dentures or bridgework may not be worn into surgery.  Leave your suitcase in the car. After surgery it may be brought to your room.  For patients admitted to the hospital, discharge time is determined by your treatment team.   Patients discharged the day of surgery will not be allowed to drive home.    Please read over the following fact sheets that you were given:   [facility]  _x___ Take these medicines the morning of surgery with A SIP OF WATER:    1. atorvastatin (LIPITOR)  2. predniSONE (DELTASONE)   ____ Fleet Enema (as directed)   __x__ Use CHG Soap as directed on instruction sheet  ____ Use inhalers on the day of surgery and bring to hospital day of surgery  ____ Stop metformin 2 days prior to surgery    ____ Take 1/2 of usual insulin dose the  night before surgery and none on the morning of surgery.   __x__ Stop aspirin 1 week prior to surgery per Dr. Rosita KeaMenz instruction.  __x__ Stop Anti-inflammatories such as Advil, Aleve, Ibuprofen, Motrin, Naproxen, Naprosyn, Goodies powders or aspirin products. OK to take Tylenol or  Oxycodone.   __x__ Stop supplements: Fish oil until after surgery.    _x___ Bring C-Pap to the hospital.

## 2016-09-26 LAB — URINE CULTURE: Culture: NO GROWTH

## 2016-10-06 MED ORDER — CLINDAMYCIN PHOSPHATE 900 MG/50ML IV SOLN
900.0000 mg | Freq: Once | INTRAVENOUS | Status: AC
  Administered 2016-10-07: 900 mg via INTRAVENOUS

## 2016-10-07 ENCOUNTER — Inpatient Hospital Stay
Admission: RE | Admit: 2016-10-07 | Discharge: 2016-10-08 | DRG: 470 | Disposition: A | Discharge status: Home Health | Payer: Medicare Other | Source: Ambulatory Visit | Attending: Orthopedic Surgery | Admitting: Orthopedic Surgery

## 2016-10-07 ENCOUNTER — Encounter
Admission: RE | Disposition: A | Discharge status: Home Health | Payer: Self-pay | Source: Ambulatory Visit | Attending: Orthopedic Surgery

## 2016-10-07 ENCOUNTER — Inpatient Hospital Stay: Payer: Medicare Other

## 2016-10-07 ENCOUNTER — Inpatient Hospital Stay: Payer: Medicare Other | Admitting: Anesthesiology

## 2016-10-07 DIAGNOSIS — Z87891 Personal history of nicotine dependence: Secondary | ICD-10-CM

## 2016-10-07 DIAGNOSIS — Z8673 Personal history of transient ischemic attack (TIA), and cerebral infarction without residual deficits: Secondary | ICD-10-CM | POA: Diagnosis not present

## 2016-10-07 DIAGNOSIS — M353 Polymyalgia rheumatica: Secondary | ICD-10-CM | POA: Diagnosis present

## 2016-10-07 DIAGNOSIS — M6281 Muscle weakness (generalized): Secondary | ICD-10-CM

## 2016-10-07 DIAGNOSIS — G8918 Other acute postprocedural pain: Secondary | ICD-10-CM

## 2016-10-07 DIAGNOSIS — I10 Essential (primary) hypertension: Secondary | ICD-10-CM | POA: Diagnosis present

## 2016-10-07 DIAGNOSIS — M170 Bilateral primary osteoarthritis of knee: Secondary | ICD-10-CM | POA: Diagnosis present

## 2016-10-07 DIAGNOSIS — H9190 Unspecified hearing loss, unspecified ear: Secondary | ICD-10-CM | POA: Diagnosis present

## 2016-10-07 DIAGNOSIS — Z79899 Other long term (current) drug therapy: Secondary | ICD-10-CM

## 2016-10-07 DIAGNOSIS — Z7952 Long term (current) use of systemic steroids: Secondary | ICD-10-CM | POA: Diagnosis not present

## 2016-10-07 DIAGNOSIS — G4730 Sleep apnea, unspecified: Secondary | ICD-10-CM | POA: Diagnosis present

## 2016-10-07 DIAGNOSIS — K219 Gastro-esophageal reflux disease without esophagitis: Secondary | ICD-10-CM | POA: Diagnosis present

## 2016-10-07 DIAGNOSIS — M25561 Pain in right knee: Secondary | ICD-10-CM | POA: Diagnosis present

## 2016-10-07 DIAGNOSIS — M1711 Unilateral primary osteoarthritis, right knee: Secondary | ICD-10-CM | POA: Diagnosis present

## 2016-10-07 DIAGNOSIS — Z880 Allergy status to penicillin: Secondary | ICD-10-CM | POA: Diagnosis not present

## 2016-10-07 DIAGNOSIS — M25661 Stiffness of right knee, not elsewhere classified: Secondary | ICD-10-CM

## 2016-10-07 HISTORY — PX: TOTAL KNEE ARTHROPLASTY: SHX125

## 2016-10-07 LAB — CBC
HEMATOCRIT: 38.2 % — AB (ref 40.0–52.0)
Hemoglobin: 13 g/dL (ref 13.0–18.0)
MCH: 29.8 pg (ref 26.0–34.0)
MCHC: 34 g/dL (ref 32.0–36.0)
MCV: 87.5 fL (ref 80.0–100.0)
Platelets: 224 10*3/uL (ref 150–440)
RBC: 4.36 MIL/uL — AB (ref 4.40–5.90)
RDW: 14.8 % — ABNORMAL HIGH (ref 11.5–14.5)
WBC: 7.6 10*3/uL (ref 3.8–10.6)

## 2016-10-07 LAB — CREATININE, SERUM
Creatinine, Ser: 0.73 mg/dL (ref 0.61–1.24)
GFR calc non Af Amer: >60 mL/min (ref >60)

## 2016-10-07 SURGERY — ARTHROPLASTY, KNEE, TOTAL
Anesthesia: Spinal | Laterality: Right | Wound class: Clean

## 2016-10-07 MED ORDER — MORPHINE SULFATE (PF) 10 MG/ML IV SOLN
INTRAVENOUS | Status: AC
  Filled 2016-10-07: qty 1

## 2016-10-07 MED ORDER — MAGNESIUM HYDROXIDE 400 MG/5ML PO SUSP
30.0000 mL | Freq: Every day | ORAL | Status: DC | PRN
  Administered 2016-10-08: 30 mL via ORAL
  Filled 2016-10-07: qty 30

## 2016-10-07 MED ORDER — METOCLOPRAMIDE HCL 5 MG/ML IJ SOLN: 5.0000 mg | Freq: Three times a day (TID) | INTRAMUSCULAR | Status: DC | PRN

## 2016-10-07 MED ORDER — BUPIVACAINE-EPINEPHRINE (PF) 0.25% -1:200000 IJ SOLN
INTRAMUSCULAR | Status: DC | PRN
  Administered 2016-10-07: 30 mL

## 2016-10-07 MED ORDER — PHENYLEPHRINE HCL 10 MG/ML IJ SOLN
INTRAMUSCULAR | Status: DC | PRN
  Administered 2016-10-07: 200 ug via INTRAVENOUS
  Administered 2016-10-07 (×8): 100 ug via INTRAVENOUS

## 2016-10-07 MED ORDER — AMLODIPINE BESYLATE 10 MG PO TABS
10.0000 mg | ORAL_TABLET | Freq: Every day | ORAL | Status: DC
  Administered 2016-10-07: 10 mg via ORAL
  Filled 2016-10-07: qty 1

## 2016-10-07 MED ORDER — CLINDAMYCIN PHOSPHATE 900 MG/50ML IV SOLN
INTRAVENOUS | Status: AC
  Filled 2016-10-07: qty 50

## 2016-10-07 MED ORDER — EPHEDRINE SULFATE 50 MG/ML IJ SOLN
INTRAMUSCULAR | Status: DC | PRN
  Administered 2016-10-07 (×2): 2.5 mg via INTRAVENOUS

## 2016-10-07 MED ORDER — SODIUM CHLORIDE 0.9 % IJ SOLN
INTRAMUSCULAR | Status: AC
  Filled 2016-10-07: qty 50

## 2016-10-07 MED ORDER — FAMOTIDINE 20 MG PO TABS
20.0000 mg | ORAL_TABLET | Freq: Once | ORAL | Status: AC
Start: 2016-10-07 — End: 2016-10-07
  Administered 2016-10-07: 20 mg via ORAL

## 2016-10-07 MED ORDER — ONDANSETRON HCL 4 MG/2ML IJ SOLN: 4.0000 mg | Freq: Once | INTRAMUSCULAR | Status: DC | PRN

## 2016-10-07 MED ORDER — PROPOFOL 10 MG/ML IV BOLUS
INTRAVENOUS | Status: DC | PRN
  Administered 2016-10-07: 40 mg via INTRAVENOUS

## 2016-10-07 MED ORDER — METHOCARBAMOL 500 MG PO TABS
500.0000 mg | ORAL_TABLET | Freq: Four times a day (QID) | ORAL | Status: DC | PRN
  Administered 2016-10-07: 500 mg via ORAL
  Filled 2016-10-07: qty 1

## 2016-10-07 MED ORDER — METOCLOPRAMIDE HCL 10 MG PO TABS: 5.0000 mg | ORAL_TABLET | Freq: Three times a day (TID) | ORAL | Status: DC | PRN

## 2016-10-07 MED ORDER — CLINDAMYCIN PHOSPHATE 900 MG/50ML IV SOLN
900.0000 mg | Freq: Four times a day (QID) | INTRAVENOUS | Status: AC
  Administered 2016-10-07 (×3): 900 mg via INTRAVENOUS
  Filled 2016-10-07 (×3): qty 50

## 2016-10-07 MED ORDER — BISACODYL 10 MG RE SUPP: 10.0000 mg | Freq: Every day | RECTAL | Status: DC | PRN

## 2016-10-07 MED ORDER — ONDANSETRON HCL 4 MG PO TABS: 4.0000 mg | ORAL_TABLET | Freq: Four times a day (QID) | ORAL | Status: DC | PRN

## 2016-10-07 MED ORDER — ONDANSETRON HCL 4 MG/2ML IJ SOLN
4.0000 mg | Freq: Four times a day (QID) | INTRAMUSCULAR | Status: DC | PRN
Start: 2016-10-07 — End: 2016-10-08

## 2016-10-07 MED ORDER — MENTHOL 3 MG MT LOZG: 1.0000 | LOZENGE | OROMUCOSAL | Status: DC | PRN

## 2016-10-07 MED ORDER — ACETAMINOPHEN 10 MG/ML IV SOLN
INTRAVENOUS | Status: DC | PRN
  Administered 2016-10-07: 1000 mg via INTRAVENOUS

## 2016-10-07 MED ORDER — NEOMYCIN-POLYMYXIN B GU 40-200000 IR SOLN
Status: AC
  Filled 2016-10-07: qty 20

## 2016-10-07 MED ORDER — PROPOFOL 500 MG/50ML IV EMUL
INTRAVENOUS | Status: DC | PRN
  Administered 2016-10-07: 75 ug/kg/min via INTRAVENOUS

## 2016-10-07 MED ORDER — ZOLPIDEM TARTRATE 5 MG PO TABS
5.0000 mg | ORAL_TABLET | Freq: Every evening | ORAL | Status: DC | PRN
  Administered 2016-10-07: 5 mg via ORAL
  Filled 2016-10-07: qty 1

## 2016-10-07 MED ORDER — FENTANYL CITRATE (PF) 100 MCG/2ML IJ SOLN: 25.0000 ug | INTRAMUSCULAR | Status: DC | PRN

## 2016-10-07 MED ORDER — KETOROLAC TROMETHAMINE 30 MG/ML IJ SOLN
INTRAMUSCULAR | Status: DC | PRN
  Administered 2016-10-07: 30 mg via INTRA_ARTICULAR

## 2016-10-07 MED ORDER — METHOCARBAMOL 1000 MG/10ML IJ SOLN
500.0000 mg | Freq: Four times a day (QID) | INTRAVENOUS | Status: DC | PRN
  Filled 2016-10-07: qty 5

## 2016-10-07 MED ORDER — LIDOCAINE HCL (CARDIAC) 20 MG/ML IV SOLN
INTRAVENOUS | Status: DC | PRN
  Administered 2016-10-07: 50 mg via INTRAVENOUS

## 2016-10-07 MED ORDER — METHYLPREDNISOLONE SODIUM SUCC 40 MG IJ SOLR
40.0000 mg | Freq: Four times a day (QID) | INTRAMUSCULAR | Status: AC
  Administered 2016-10-07 – 2016-10-08 (×4): 40 mg via INTRAVENOUS
  Filled 2016-10-07 (×4): qty 1

## 2016-10-07 MED ORDER — PHENOL 1.4 % MT LIQD: 1.0000 | OROMUCOSAL | Status: DC | PRN

## 2016-10-07 MED ORDER — MORPHINE SULFATE (PF) 2 MG/ML IV SOLN
2.0000 mg | INTRAVENOUS | Status: DC | PRN
  Administered 2016-10-07: 2 mg via INTRAVENOUS
  Filled 2016-10-07: qty 1

## 2016-10-07 MED ORDER — SODIUM CHLORIDE 0.9 % IV SOLN
INTRAVENOUS | Status: DC | PRN
  Administered 2016-10-07: 20 ug/min via INTRAVENOUS

## 2016-10-07 MED ORDER — ACETAMINOPHEN 650 MG RE SUPP: 650.0000 mg | Freq: Four times a day (QID) | RECTAL | Status: DC | PRN

## 2016-10-07 MED ORDER — BUPIVACAINE-EPINEPHRINE (PF) 0.25% -1:200000 IJ SOLN
INTRAMUSCULAR | Status: AC
  Filled 2016-10-07: qty 30

## 2016-10-07 MED ORDER — OXYCODONE HCL 5 MG PO TABS
5.0000 mg | ORAL_TABLET | ORAL | Status: DC | PRN
  Administered 2016-10-07 (×4): 10 mg via ORAL
  Administered 2016-10-08: 5 mg via ORAL
  Administered 2016-10-08: 10 mg via ORAL
  Filled 2016-10-07: qty 1
  Filled 2016-10-07 (×5): qty 2

## 2016-10-07 MED ORDER — ENOXAPARIN SODIUM 30 MG/0.3ML ~~LOC~~ SOLN
30.0000 mg | Freq: Two times a day (BID) | SUBCUTANEOUS | Status: DC
  Administered 2016-10-08: 30 mg via SUBCUTANEOUS
  Filled 2016-10-07: qty 0.3

## 2016-10-07 MED ORDER — VITAMIN D 1000 UNITS PO TABS
2000.0000 [IU] | ORAL_TABLET | Freq: Every day | ORAL | Status: DC
  Administered 2016-10-07 – 2016-10-08 (×2): 2000 [IU] via ORAL
  Filled 2016-10-07 (×3): qty 2

## 2016-10-07 MED ORDER — SODIUM CHLORIDE 0.9 % IV SOLN
INTRAVENOUS | Status: DC
  Administered 2016-10-07 (×2): via INTRAVENOUS

## 2016-10-07 MED ORDER — MIDAZOLAM HCL 5 MG/5ML IJ SOLN
INTRAMUSCULAR | Status: DC | PRN
  Administered 2016-10-07 (×2): 1 mg via INTRAVENOUS

## 2016-10-07 MED ORDER — DIPHENHYDRAMINE HCL 12.5 MG/5ML PO ELIX: 12.5000 mg | ORAL_SOLUTION | ORAL | Status: DC | PRN

## 2016-10-07 MED ORDER — NEOMYCIN-POLYMYXIN B GU 40-200000 IR SOLN
Status: DC | PRN
  Administered 2016-10-07: 16 mL

## 2016-10-07 MED ORDER — ACETAMINOPHEN 325 MG PO TABS: 650.0000 mg | ORAL_TABLET | Freq: Four times a day (QID) | ORAL | Status: DC | PRN

## 2016-10-07 MED ORDER — MORPHINE SULFATE 10 MG/ML IJ SOLN
INTRAMUSCULAR | Status: DC | PRN
  Administered 2016-10-07: 10 mg via INTRAVENOUS

## 2016-10-07 MED ORDER — MAGNESIUM CITRATE PO SOLN
1.0000 | Freq: Once | ORAL | Status: DC | PRN
Start: 2016-10-07 — End: 2016-10-08

## 2016-10-07 MED ORDER — BUPIVACAINE LIPOSOME 1.3 % IJ SUSP
INTRAMUSCULAR | Status: AC
  Filled 2016-10-07: qty 20

## 2016-10-07 MED ORDER — ATORVASTATIN CALCIUM 20 MG PO TABS
40.0000 mg | ORAL_TABLET | Freq: Every morning | ORAL | Status: DC
  Administered 2016-10-07 – 2016-10-08 (×2): 40 mg via ORAL
  Filled 2016-10-07 (×2): qty 2

## 2016-10-07 MED ORDER — ONDANSETRON HCL 4 MG/2ML IJ SOLN
INTRAMUSCULAR | Status: DC | PRN
  Administered 2016-10-07: 4 mg via INTRAVENOUS

## 2016-10-07 MED ORDER — SODIUM CHLORIDE 0.9 % IV SOLN
INTRAVENOUS | Status: DC | PRN
  Administered 2016-10-07: 60 mL

## 2016-10-07 MED ORDER — ASPIRIN 325 MG PO TABS
650.0000 mg | ORAL_TABLET | Freq: Every day | ORAL | Status: DC
  Filled 2016-10-07 (×2): qty 2

## 2016-10-07 MED ORDER — LACTATED RINGERS IV SOLN
INTRAVENOUS | Status: DC
  Administered 2016-10-07 (×2): via INTRAVENOUS

## 2016-10-07 MED ORDER — PREDNISONE 1 MG PO TABS
2.0000 mg | ORAL_TABLET | Freq: Every day | ORAL | Status: DC
  Administered 2016-10-08: 2 mg via ORAL
  Filled 2016-10-07: qty 2

## 2016-10-07 MED ORDER — FENTANYL CITRATE (PF) 100 MCG/2ML IJ SOLN
INTRAMUSCULAR | Status: DC | PRN
  Administered 2016-10-07 (×2): 25 ug via INTRAVENOUS

## 2016-10-07 MED ORDER — FAMOTIDINE 20 MG PO TABS
ORAL_TABLET | ORAL | Status: AC
  Administered 2016-10-07: 20 mg via ORAL
  Filled 2016-10-07: qty 1

## 2016-10-07 MED ORDER — DOCUSATE SODIUM 100 MG PO CAPS
100.0000 mg | ORAL_CAPSULE | Freq: Two times a day (BID) | ORAL | Status: DC
  Administered 2016-10-07 – 2016-10-08 (×3): 100 mg via ORAL
  Filled 2016-10-07 (×3): qty 1

## 2016-10-07 SURGICAL SUPPLY — 61 items
BANDAGE ACE 6X5 VEL STRL LF (GAUZE/BANDAGES/DRESSINGS) ×3 IMPLANT
BLADE SAW 1 (BLADE) ×3 IMPLANT
BLOCK CUTTING FEMUR 4 RT (MISCELLANEOUS) IMPLANT
BLOCK CUTTING TIBIAL 4 RT (MISCELLANEOUS) IMPLANT
BLOCK CUTTING TIBIAL 4 RT MIS (MISCELLANEOUS) IMPLANT
CANISTER SUCT 1200ML W/VALVE (MISCELLANEOUS) ×3 IMPLANT
CANISTER SUCT 3000ML (MISCELLANEOUS) ×6 IMPLANT
CAPT KNEE TOTAL 3 ×3 IMPLANT
CATH FOL LEG HOLDER (MISCELLANEOUS) ×3 IMPLANT
CATH TRAY METER 16FR LF (MISCELLANEOUS) ×3 IMPLANT
CEMENT HV SMART SET (Cement) ×6 IMPLANT
CHLORAPREP W/TINT 26ML (MISCELLANEOUS) ×6 IMPLANT
COOLER POLAR GLACIER W/PUMP (MISCELLANEOUS) ×3 IMPLANT
CUFF TOURN 24 STER (MISCELLANEOUS) IMPLANT
CUFF TOURN 30 STER DUAL PORT (MISCELLANEOUS) IMPLANT
DRAPE INCISE IOBAN 66X45 STRL (DRAPES) ×6 IMPLANT
DRAPE SHEET LG 3/4 BI-LAMINATE (DRAPES) ×6 IMPLANT
ELECT CAUTERY BLADE 6.4 (BLADE) ×3 IMPLANT
ELECT REM PT RETURN 9FT ADLT (ELECTROSURGICAL) ×3
ELECTRODE REM PT RTRN 9FT ADLT (ELECTROSURGICAL) ×1 IMPLANT
GAUZE PETRO XEROFOAM 1X8 (MISCELLANEOUS) ×3 IMPLANT
GAUZE SPONGE 4X4 12PLY STRL (GAUZE/BANDAGES/DRESSINGS) ×3 IMPLANT
GLOVE BIOGEL PI IND STRL 9 (GLOVE) ×1 IMPLANT
GLOVE BIOGEL PI INDICATOR 9 (GLOVE) ×2
GLOVE INDICATOR 8.0 STRL GRN (GLOVE) ×3 IMPLANT
GLOVE SURG ORTHO 8.0 STRL STRW (GLOVE) ×3 IMPLANT
GLOVE SURG SYN 9.0  PF PI (GLOVE) ×2
GLOVE SURG SYN 9.0 PF PI (GLOVE) ×1 IMPLANT
GOWN SRG 2XL LVL 4 RGLN SLV (GOWNS) ×1 IMPLANT
GOWN STRL NON-REIN 2XL LVL4 (GOWNS) ×2
GOWN STRL REUS W/ TWL LRG LVL3 (GOWN DISPOSABLE) ×1 IMPLANT
GOWN STRL REUS W/ TWL XL LVL3 (GOWN DISPOSABLE) ×1 IMPLANT
GOWN STRL REUS W/TWL LRG LVL3 (GOWN DISPOSABLE) ×2
GOWN STRL REUS W/TWL XL LVL3 (GOWN DISPOSABLE) ×2
HANDPIECE INTERPULSE COAX TIP (DISPOSABLE) ×2
HOOD PEEL AWAY FLYTE STAYCOOL (MISCELLANEOUS) ×6 IMPLANT
IMMBOLIZER KNEE 19 BLUE UNIV (SOFTGOODS) ×3 IMPLANT
KIT RM TURNOVER STRD PROC AR (KITS) ×3 IMPLANT
KNEE MEDACTA TIBIAL/FEMORAL BL (Knees) ×3 IMPLANT
KNIFE SCULPS 14X20 (INSTRUMENTS) ×3 IMPLANT
NDL SAFETY 18GX1.5 (NEEDLE) ×3 IMPLANT
NEEDLE SPNL 18GX3.5 QUINCKE PK (NEEDLE) ×3 IMPLANT
NEEDLE SPNL 20GX3.5 QUINCKE YW (NEEDLE) ×3 IMPLANT
NS IRRIG 1000ML POUR BTL (IV SOLUTION) ×3 IMPLANT
PACK TOTAL KNEE (MISCELLANEOUS) ×3 IMPLANT
PAD WRAPON POLAR KNEE (MISCELLANEOUS) ×1 IMPLANT
SET HNDPC FAN SPRY TIP SCT (DISPOSABLE) ×1 IMPLANT
SOL .9 NS 3000ML IRR  AL (IV SOLUTION) ×2
SOL .9 NS 3000ML IRR UROMATIC (IV SOLUTION) ×1 IMPLANT
STAPLER SKIN PROX 35W (STAPLE) ×3 IMPLANT
SUCTION FRAZIER HANDLE 10FR (MISCELLANEOUS) ×2
SUCTION TUBE FRAZIER 10FR DISP (MISCELLANEOUS) ×1 IMPLANT
SUT DVC 2 QUILL PDO  T11 36X36 (SUTURE) ×2
SUT DVC 2 QUILL PDO T11 36X36 (SUTURE) ×1 IMPLANT
SUT DVC QUILL MONODERM 30X30 (SUTURE) ×3 IMPLANT
SUT V-LOC 90 ABS DVC 3-0 CL (SUTURE) ×3 IMPLANT
SYR 20CC LL (SYRINGE) ×3 IMPLANT
SYR 50ML LL SCALE MARK (SYRINGE) ×6 IMPLANT
TOWEL OR 17X26 4PK STRL BLUE (TOWEL DISPOSABLE) ×3 IMPLANT
TOWER CARTRIDGE SMART MIX (DISPOSABLE) ×3 IMPLANT
WRAPON POLAR PAD KNEE (MISCELLANEOUS) ×3

## 2016-10-07 NOTE — Progress Notes (Signed)
Pt received oxycodone and returned to bed from recliner, using walker and minimal assisst.

## 2016-10-07 NOTE — Plan of Care (Signed)
Problem: Bowel/Gastric: Goal: Will not experience complications related to bowel motility Pt is progressing toward goalws.

## 2016-10-07 NOTE — Anesthesia Procedure Notes (Signed)
Spinal  Patient location during procedure: OR Staffing Anesthesiologist: Berdine AddisonHOMAS, Rema Lievanos Performed: anesthesiologist  Preanesthetic Checklist Completed: patient identified, site marked, surgical consent, pre-op evaluation, timeout performed, IV checked and risks and benefits discussed Spinal Block Patient position: sitting Prep: Betadine Patient monitoring: heart rate, cardiac monitor, continuous pulse ox and blood pressure Approach: midline Location: L3-4 Injection technique: single-shot Needle Needle type: Pencil-Tip  Needle gauge: 25 G Needle length: 9 cm Assessment Sensory level: T10 Additional Notes 0809 marcaine 1.6 ml.

## 2016-10-07 NOTE — [place] FL2 (Signed)
[place] MEDICAID FL2 LEVEL OF CARE SCREENING TOOL     IDENTIFICATION  Patient Name: Adam Villa Birthdate: [birth year] Sex: male Admission Date (Current Location): 10/07/2016  [place]   Facility and Address:  [facility], [address], [place], [place] [address]      Provider Number: [id number]  Attending Physician Name and Address:  Kennedy BuckerMichael Menz, MD  Relative Name and Phone Number:       Current Level of Care: Hospital Recommended Level of Care: Skilled Nursing Facility Prior Approval Number:    Date Approved/Denied:   PASRR Number:  [id number])  Discharge Plan: SNF    Current Diagnoses: Patient Active Problem List   Diagnosis Date Noted  . Primary osteoarthritis of right knee 10/07/2016  . Primary localized osteoarthritis of left knee 08/12/2016   Hypertension    Hearing loss  right   Osteoarthritis  Bilateral knees  Hyperglycemia, unspecified    Sleep apnea  questionable  PMR (polymyalgia rheumatica) (CMS-HCC)    Stroke (CMS-HCC)       Orientation RESPIRATION BLADDER Height & Weight     Self, Time, Situation, Place  Normal Continent Weight: 198 lb 8 oz (90 kg) Height:  5\' 10"  (177.8 cm)  BEHAVIORAL SYMPTOMS/MOOD NEUROLOGICAL BOWEL NUTRITION STATUS   (none)  (none) Continent Diet (Regular Diet )  AMBULATORY STATUS COMMUNICATION OF NEEDS Skin   Extensive Assist Verbally Surgical wounds (Incision: Right Knee )                       Personal Care Assistance Level of Assistance  Bathing, Feeding, Dressing Bathing Assistance: Limited assistance Feeding assistance: Independent Dressing Assistance: Limited assistance     Functional Limitations Info  Sight, Hearing, Speech Sight Info: Adequate Hearing Info: Adequate Speech Info: Adequate    SPECIAL CARE FACTORS FREQUENCY  PT (By licensed PT), OT (By licensed OT)     PT Frequency:  (5) OT Frequency:  (5)             Contractures      Additional Factors Info  Code Status, Allergies Code Status Info:  (Full Code. ) Allergies Info:  ( Penicillins)           Current Medications (10/07/2016):  This is the current hospital active medication list Current Facility-Administered Medications  Medication Dose Route Frequency Provider Last Rate Last Dose  . 0.9 %  sodium chloride infusion   Intravenous Continuous Kennedy BuckerMichael Menz, MD 75 mL/hr at 10/07/16 1132    . acetaminophen (TYLENOL) tablet 650 mg  650 mg Oral Q6H PRN Kennedy BuckerMichael Menz, MD       Or  . acetaminophen (TYLENOL) suppository 650 mg  650 mg Rectal Q6H PRN Kennedy BuckerMichael Menz, MD      . amLODipine (NORVASC) tablet 10 mg  10 mg Oral QHS Kennedy BuckerMichael Menz, MD      . aspirin tablet 650 mg  650 mg Oral Daily Kennedy BuckerMichael Menz, MD      . atorvastatin (LIPITOR) tablet 40 mg  40 mg Oral q morning - 10a Kennedy BuckerMichael Menz, MD   40 mg at 10/07/16 1341  . bisacodyl (DULCOLAX) suppository 10 mg  10 mg Rectal Daily PRN Kennedy BuckerMichael Menz, MD      . cholecalciferol (VITAMIN D) tablet 2,000 Units  2,000 Units Oral Daily Kennedy BuckerMichael Menz, MD   2,000 Units at 10/07/16 1341  . clindamycin (CLEOCIN) IVPB 900 mg  900 mg Intravenous Q6H Kennedy BuckerMichael Menz, MD  900 mg at 10/07/16 1240  . diphenhydrAMINE (BENADRYL) 12.5 MG/5ML elixir 12.5-25 mg  12.5-25 mg Oral Q4H PRN Kennedy BuckerMichael Menz, MD      . docusate sodium (COLACE) capsule 100 mg  100 mg Oral BID Kennedy BuckerMichael Menz, MD   100 mg at 10/07/16 1341  . [START ON 10/08/2016] enoxaparin (LOVENOX) injection 30 mg  30 mg Subcutaneous Q12H Kennedy BuckerMichael Menz, MD      . magnesium citrate solution 1 Bottle  1 Bottle Oral Once PRN Kennedy BuckerMichael Menz, MD      . magnesium hydroxide (MILK OF MAGNESIA) suspension 30 mL  30 mL Oral Daily PRN Kennedy BuckerMichael Menz, MD      . menthol-cetylpyridinium (CEPACOL) lozenge 3 mg  1 lozenge Oral PRN Kennedy BuckerMichael Menz, MD       Or  . phenol (CHLORASEPTIC) mouth spray 1 spray  1 spray Mouth/Throat PRN Kennedy BuckerMichael Menz, MD      . methocarbamol (ROBAXIN) tablet 500 mg   500 mg Oral Q6H PRN Kennedy BuckerMichael Menz, MD   500 mg at 10/07/16 1341   Or  . methocarbamol (ROBAXIN) 500 mg in dextrose 5 % 50 mL IVPB  500 mg Intravenous Q6H PRN Kennedy BuckerMichael Menz, MD      . methylPREDNISolone sodium succinate (SOLU-MEDROL) 40 mg/mL injection 40 mg  40 mg Intravenous Q6H Kennedy BuckerMichael Menz, MD   40 mg at 10/07/16 1240  . metoCLOPramide (REGLAN) tablet 5-10 mg  5-10 mg Oral Q8H PRN Kennedy BuckerMichael Menz, MD       Or  . metoCLOPramide [facility](REGLAN) injection 5-10 mg  5-10 mg Intravenous Q8H PRN Kennedy BuckerMichael Menz, MD      . morphine 2 MG/ML injection 2 mg  2 mg Intravenous Q2H PRN Kennedy BuckerMichael Menz, MD   2 mg at 10/07/16 1249  . ondansetron (ZOFRAN) tablet 4 mg  4 mg Oral Q6H PRN Kennedy BuckerMichael Menz, MD       Or  . ondansetron [facility](ZOFRAN) injection 4 mg  4 mg Intravenous Q6H PRN Kennedy BuckerMichael Menz, MD      . oxyCODONE (Oxy IR/ROXICODONE) immediate release tablet 5-10 mg  5-10 mg Oral Q3H PRN Kennedy BuckerMichael Menz, MD   10 mg at 10/07/16 1341  . [START ON 10/08/2016] predniSONE (DELTASONE) tablet 2 mg  2 mg Oral Q breakfast Kennedy BuckerMichael Menz, MD      . zolpidem [facility](AMBIEN) tablet 5 mg  5 mg Oral QHS PRN Kennedy BuckerMichael Menz, MD         Discharge Medications: Please see discharge summary for a list of discharge medications.  Relevant Imaging Results:  Relevant Lab Results:   Additional Information  (SSN: [id number])  Nkenge Sonntag, Darleen CrockerBailey M, LCSW

## 2016-10-07 NOTE — Evaluation (Signed)
Physical Therapy Evaluation Patient Details Name: Cline CoolsDavid E Ned MRN: [id number] DOB: 01-Dec-1950 Today's Date: 10/07/2016   History of Present Illness  65 y.o. male who s/p right TKR without reported post-op complications. Pt is POD#0 at time of initial evaluation  Clinical Impression  Pt admitted with above diagnosis. Pt currently with functional limitations due to the deficits listed below (see PT Problem List).  Pt recently underwent L TKR and is proficient with exercises. He is able to complete all supine exercises as instructed and able to perform SLR and SAQ without assistance. Pt able to perform bed mobility with minA+1 and transfers/ambulation CGA only. Pt able to safely ambulate from bed to recliner with rolling walker. Pt demonstrates excellent AAROM for POD#0 at -5 to 94 degrees. Pt will benefit from skilled PT services to address deficits in strength, balance, and mobility in order to return to full function at home.     Follow Up Recommendations Home health PT    Equipment Recommendations  None recommended by PT    Recommendations for Other Services       Precautions / Restrictions Precautions Precautions: Fall;Knee Precaution Booklet Issued: Yes (comment) Restrictions Weight Bearing Restrictions: Yes      Mobility  Bed Mobility Overal bed mobility: Needs Assistance Bed Mobility: Supine to Sit     Supine to sit: Min assist     General bed mobility comments: Pt requires some UE assist to go from supine to sitting at EOB. HOB elevated and use of bed rails. Good R hip flexion and R hip abduction strength noted  Transfers Overall transfer level: Needs assistance Equipment used: Rolling walker (2 wheeled) Transfers: Sit to/from Stand Sit to Stand: Min guard         General transfer comment: Pt demonstrates good strength and stability during sit to stand. Decreased weight shifting to RLE during transfer. Once upright pt able to remain standing without UE  support  Ambulation/Gait Ambulation/Gait assistance: Min guard Ambulation Distance (Feet): 5 Feet Assistive device: Rolling walker (2 wheeled) Gait Pattern/deviations: Step-to pattern;Decreased step length - left;Decreased stance time - right Gait velocity: Decreased Gait velocity interpretation: <1.8 ft/sec, indicative of risk for recurrent falls General Gait Details: Pt able to ambulate from bed to recliner with rolling walker. Decreased stance time and weight shifting RLE. Pt demonstrates good sequencing with walker.   Stairs            Wheelchair Mobility    Modified Rankin (Stroke Patients Only)       Balance Overall balance assessment: Needs assistance Sitting-balance support: No upper extremity supported Sitting balance-Leahy Scale: Good     Standing balance support: No upper extremity supported Standing balance-Leahy Scale: Fair Standing balance comment: Pt able to maintain static balance without UE support but minimal weight shifting to RLE                             Pertinent Vitals/Pain Pain Assessment: 0-10 Pain Score: 5  Pain Location: R thigh Pain Descriptors / Indicators: Operative site guarding Pain Intervention(s): Monitored during session;Premedicated before session;Limited activity within patient's tolerance    Home Living Family/patient expects to be discharged to:: Private residence Living Arrangements: Spouse/significant other Available Help at Discharge: Family Type of Home: House Home Access: Stairs to enter Entrance Stairs-Rails: Left Entrance Stairs-Number of Steps: 5 Home Layout: One level Home Equipment: Crutches;Walker - 2 wheels;Shower seat - built in;Cane - single point  Prior Function Level of Independence: Independent         Comments: Independent with ADLs/IADLs, drives. full community ambulator without assistive device     Hand Dominance   Dominant Hand: Right    Extremity/Trunk Assessment    Upper Extremity Assessment: Overall WFL for tasks assessed           Lower Extremity Assessment: RLE deficits/detail RLE Deficits / Details: Pt able to perform SLR and SAQ without assistance. Full DF/PF and intact sensation in RLE       Communication   Communication: No difficulties  Cognition Arousal/Alertness: Awake/alert Behavior During Therapy: WFL for tasks assessed/performed Overall Cognitive Status: Within Functional Limits for tasks assessed                      General Comments      Exercises Total Joint Exercises Ankle Circles/Pumps: Strengthening;Both;10 reps;Supine Quad Sets: Strengthening;Both;10 reps;Supine Gluteal Sets: Strengthening;Both;10 reps;Supine Towel Squeeze: Strengthening;Both;10 reps;Supine Short Arc Quad: Strengthening;Right;10 reps;Supine Heel Slides: Strengthening;Right;10 reps;Supine Hip ABduction/ADduction: Strengthening;Right;10 reps;Supine Straight Leg Raises: Strengthening;Right;10 reps;Supine Goniometric ROM: -5 to 94 degrees AAROM, pain limited   Assessment/Plan    PT Assessment Patient needs continued PT services  PT Problem List Decreased strength;Decreased range of motion;Decreased activity tolerance;Decreased balance;Decreased knowledge of use of DME;Pain          PT Treatment Interventions DME instruction;Gait training;Stair training;Therapeutic activities;Balance training;Therapeutic exercise;Neuromuscular re-education;Patient/family education;Manual techniques    PT Goals (Current goals can be found in the Care Plan section)  Acute Rehab PT Goals Patient Stated Goal: Return to prior level of function at home with less pain PT Goal Formulation: With patient Time For Goal Achievement: 10/21/16 Potential to Achieve Goals: Good    Frequency BID   Barriers to discharge        Co-evaluation               End of Session Equipment Utilized During Treatment: Gait belt Activity Tolerance: Patient tolerated  treatment well Patient left: in chair;with call bell/phone within reach;with chair alarm set;with SCD's reapplied;Other (comment) (towel roll under heel, polar care in place) Nurse Communication: Mobility status         Time: [id number] PT Time Calculation (min) (ACUTE ONLY): 25 min   Charges:   PT Evaluation $PT Eval Low Complexity: 1 Procedure PT Treatments $Therapeutic Exercise: 8-22 mins   PT G Codes:       Sharalyn InkJason D Yogesh Cominsky PT, DPT   Maisa Bedingfield 10/07/2016, 5:11 PM

## 2016-10-07 NOTE — Anesthesia Preprocedure Evaluation (Signed)
Anesthesia Evaluation  Patient identified by MRN, date of birth, ID band Patient awake    Reviewed: Allergy & Precautions, NPO status , Patient's Chart, lab work & pertinent test results, reviewed documented beta blocker date and time   Airway Mallampati: II  TM Distance: >3 FB     Dental  (+) Chipped   Pulmonary sleep apnea and Continuous Positive Airway Pressure Ventilation , former smoker,           Cardiovascular hypertension, Pt. on medications      Neuro/Psych CVA, No Residual Symptoms    GI/Hepatic GERD  Controlled,  Endo/Other    Renal/GU      Musculoskeletal  (+) Arthritis ,   Abdominal   Peds  Hematology   Anesthesia Other Findings   Reproductive/Obstetrics                             Anesthesia Physical Anesthesia Plan  ASA: III  Anesthesia Plan: Spinal   Post-op Pain Management:    Induction:   Airway Management Planned:   Additional Equipment:   Intra-op Plan:   Post-operative Plan:   Informed Consent: I have reviewed the patients History and Physical, chart, labs and discussed the procedure including the risks, benefits and alternatives for the proposed anesthesia with the patient or authorized representative who has indicated his/her understanding and acceptance.     Plan Discussed with: CRNA  Anesthesia Plan Comments:         Anesthesia Quick Evaluation

## 2016-10-07 NOTE — Progress Notes (Signed)
Pt arrived on the unit at 1130, assessment completed. Pt received morphine ivp at 1255 for pain that was just beginning. Pt is able to move his toes, r knee has been placed in bone foam, polarcare intact. PIV #20 intact to l fa, site is free of redness and swelling. Cap refill to R foot is wnl, ted in place to L leg with cap refill also wnl. Wife at bedside. Call bell in reach.

## 2016-10-07 NOTE — Op Note (Signed)
10/07/2016  10:11 AM  PATIENT:  Adam Villa  65 y.o. male  PRE-OPERATIVE DIAGNOSIS:  primary osteoarthritis of Right knee  POST-OPERATIVE DIAGNOSIS:  primary osteoarthritis of Right knee  PROCEDURE:  Procedure(s): TOTAL KNEE ARTHROPLASTY (Right)  SURGEON: Leitha SchullerMichael J Kishia Shackett, MD  ASSISTANTS: Floyce StakesGaines, PA-C  ANESTHESIA:   spinal  EBL:  Total I/O In: 1500 [I.V.:1500] Out: 375 [Urine:175; Blood:200]  BLOOD ADMINISTERED:none  DRAINS: none   LOCAL MEDICATIONS USED:  MARCAINE    and OTHER Morphine, Exparel, Toradol  SPECIMEN:  No Specimen  DISPOSITION OF SPECIMEN:  N/A  COUNTS:  YES  TOURNIQUET:  24 min at 300 mm Hg  IMPLANTS: Medacta GMK Sphere 4 femur, 4 tibia , 10 mm insert with short stem and 3 patella, all cemented  DICTATION: .Dragon Dictation patient was brought the operating room and after adequate spinal anesthesia was obtained right leg was prepped and draped in sterile fashion. After patient identification and timeout procedures were completed midline skin incision was made followed by medial parapatellar arthrotomy.t. Inspection revealed eburnated bone with mild bone loss in the medial compartment , lateral compartment had mild degenerative changes and the patellofemoral joint was bone-on-bone as well with extensive spurring. The anterior cruciate ligament, fat pad and PCL were excised at this time. The proximal tibia was exposed for the Medacta cutting block and anterior application of the block was performed followed by proximal tibia cut and removal approximately tibia bone. The menisci were excised at this time. Distal femoral cut made using the Medacta cutting guide. 4-in-1 cutting block applied anterior posterior chamfer cuts carried out. The tibia was prepared be placing the template and then drilling the proximal femur for short stem and placing the keel punch holding this in position the 4 femur was applied and distal femoral drill holes made. A 10 mm insert gave  excellent stability through range of motion the this point the trials were removed and the notch cut made for the trochlear groove. Next the patella was cut using the patellar cutting guide patella sized to size 3 after 3 drill holes were made. At this point  hemostasis checked electrocautery and the above local anesthetic infiltrated around the joint. The tourniquet was raised and the bony surfaces thoroughly irrigated and dried components were cemented into place after the cemented set excess cement was removed. The patella tracked well with no touch technique. Tourniquet was again let down and then the arthrotomy repaired using a heavy Quill, 3-0 -loc subcutaneousley and skin staples. Xeroform 4 x 4's ABDs and web roll Polar Care and Ace wrap applied  PLAN OF CARE: Admit to inpatient   PATIENT DISPOSITION:  PACU - hemodynamically stable.

## 2016-10-07 NOTE — Transfer of Care (Signed)
Immediate Anesthesia Transfer of Care Note  Patient: Adam Villa  Procedure(s) Performed: Procedure(s): TOTAL KNEE ARTHROPLASTY (Right)  Patient Location: PACU  Anesthesia Type:Spinal  Level of Consciousness: awake and alert   Airway & Oxygen Therapy: Patient Spontanous Breathing and Patient connected to face mask oxygen  Post-op Assessment: Report given to RN and Post -op Vital signs reviewed and stable  Post vital signs: Reviewed and stable  Last Vitals:  Vitals:   10/07/16 0608 10/07/16 1011  BP: (!) 148/71 (!) 90/53  Pulse: 70 66  Resp: 16 15  Temp: 36.1 C 36.5 C    Last Pain:  Vitals:   10/07/16 1011  TempSrc: Tympanic         Complications: No apparent anesthesia complications

## 2016-10-07 NOTE — Progress Notes (Signed)
Dr. Rosita KeaMenz in to see pt. Pt has been changed to regular diet, tolerated clear tray. Pt now has full sensation back to his feet and legs, rating pain at 4, received robaxin and oxycodone po. Wife remains at bedside. Call bell in reach

## 2016-10-07 NOTE — Progress Notes (Signed)
Patient was admitted to room 155 from surgery via room bed and OR staff. A&O x4. Wife at bedside. Polar care, NSL, dressing to left knee, TEDs, in place. Bed alarm on for safety. Oriented to room, call light, TV and bed controls. POC initiated.

## 2016-10-07 NOTE — Anesthesia Procedure Notes (Signed)
Date/Time: 10/07/2016 8:05 AM Performed by: Ginger CarneMICHELET, Jayelle Page Pre-anesthesia Checklist: Patient identified, Emergency Drugs available, Suction available, Patient being monitored and Timeout performed Patient Re-evaluated:Patient Re-evaluated prior to inductionOxygen Delivery Method: Simple face mask

## 2016-10-07 NOTE — H&P (Signed)
Reviewed paper H+P, will be scanned into chart. No changes noted.  

## 2016-10-08 ENCOUNTER — Encounter: Payer: Self-pay | Admitting: Orthopedic Surgery

## 2016-10-08 LAB — BASIC METABOLIC PANEL
ANION GAP: 5 (ref 5–15)
BUN: 17 mg/dL (ref 6–20)
CHLORIDE: 106 mmol/L (ref 101–111)
CO2: 24 mmol/L (ref 22–32)
Calcium: 8.5 mg/dL — ABNORMAL LOW (ref 8.9–10.3)
Creatinine, Ser: 0.71 mg/dL (ref 0.61–1.24)
GFR calc Af Amer: >60 mL/min (ref >60)
Glucose, Bld: 145 mg/dL — ABNORMAL HIGH (ref 65–99)
POTASSIUM: 3.9 mmol/L (ref 3.5–5.1)
SODIUM: 135 mmol/L (ref 135–145)

## 2016-10-08 LAB — CBC
HEMATOCRIT: 37.9 % — AB (ref 40.0–52.0)
Hemoglobin: 12.7 g/dL — ABNORMAL LOW (ref 13.0–18.0)
MCH: 29.6 pg (ref 26.0–34.0)
MCHC: 33.5 g/dL (ref 32.0–36.0)
MCV: 88.4 fL (ref 80.0–100.0)
PLATELETS: 235 10*3/uL (ref 150–440)
RBC: 4.28 MIL/uL — ABNORMAL LOW (ref 4.40–5.90)
RDW: 14.6 % — ABNORMAL HIGH (ref 11.5–14.5)
WBC: 12.5 10*3/uL — ABNORMAL HIGH (ref 3.8–10.6)

## 2016-10-08 MED ORDER — OXYCODONE HCL 5 MG PO TABS: 5.0000 mg | ORAL_TABLET | ORAL | 0 refills | Status: DC | PRN

## 2016-10-08 MED ORDER — ENOXAPARIN SODIUM 40 MG/0.4ML ~~LOC~~ SOLN: 40.0000 mg | SUBCUTANEOUS | 0 refills | Status: DC

## 2016-10-08 NOTE — Discharge Instructions (Signed)

## 2016-10-08 NOTE — Anesthesia Postprocedure Evaluation (Signed)
Anesthesia Post Note  Patient: Adam CoolsDavid E Villa  Procedure(s) Performed: Procedure(s) (LRB): TOTAL KNEE ARTHROPLASTY (Right)  Patient location during evaluation: Nursing Unit Anesthesia Type: Spinal Level of consciousness: awake, awake and alert and oriented Pain management: pain level controlled Vital Signs Assessment: post-procedure vital signs reviewed and stable Respiratory status: spontaneous breathing, nonlabored ventilation and respiratory function stable Cardiovascular status: blood pressure returned to baseline and stable Postop Assessment: no headache and no backache Anesthetic complications: no    Last Vitals:  Vitals:   10/08/16 0044 10/08/16 0457  BP: (!) 108/52 (!) 101/52  Pulse: (!) 59 60  Resp: 18 18  Temp: 36.6 C 36.6 C    Last Pain:  Vitals:   10/08/16 0610  TempSrc:   PainSc: 3                  [employer] CorporationStephanie Nijae Doyel

## 2016-10-08 NOTE — Progress Notes (Signed)
   Subjective: 1 Day Post-Op Procedure(s) (LRB): TOTAL KNEE ARTHROPLASTY (Right) Patient reports pain as 5 on 0-10 scale.   Patient is well, and has had no acute complaints or problems Denies any CP, SOB, ABD pain. We will continue therapy today.  Plan is to go Home after hospital stay.  Objective: Vital signs in last 24 hours: Temp:  [97.4 F (36.3 C)-98.9 F (37.2 C)] 98.2 F (36.8 C) (12/13 0724) Pulse Rate:  [29-66] 52 (12/13 0724) Resp:  [10-19] 19 (12/13 0724) BP: (90-117)/(50-62) 110/52 (12/13 0724) SpO2:  [95 %-100 %] 96 % (12/13 0724) FiO2 (%):  [21 %] 21 % (12/12 1116) Weight:  [90 kg (198 lb 8 oz)] 90 kg (198 lb 8 oz) (12/12 1116)  Intake/Output from previous day: 12/12 0701 - 12/13 0700 In: 3776.3 [P.O.:720; I.V.:3056.3] Out: 2050 [Urine:1850; Blood:200] Intake/Output this shift: No intake/output data recorded.   Recent Labs  10/07/16 1328 10/08/16 0512  HGB 13.0 12.7*    Recent Labs  10/07/16 1328 10/08/16 0512  WBC 7.6 12.5*  RBC 4.36* 4.28*  HCT 38.2* 37.9*  PLT 224 235    Recent Labs  10/07/16 1328 10/08/16 0512  NA  --  135  K  --  3.9  CL  --  106  CO2  --  24  BUN  --  17  CREATININE 0.73 0.71  GLUCOSE  --  145*  CALCIUM  --  8.5*   No results for input(s): LABPT, INR in the last 72 hours.  EXAM General - Patient is Alert, Appropriate and Oriented Extremity - Neurovascular intact Sensation intact distally Intact pulses distally Dorsiflexion/Plantar flexion intact No cellulitis present Compartment soft Dressing - dressing C/D/I and no drainage Motor Function - intact, moving foot and toes well on exam.   Past Medical History:  Diagnosis Date  . Arthritis   . Elevated lipids   . GERD (gastroesophageal reflux disease)   . Hypertension   . Mini stroke (HCC)    POSSIBLE-PT WAS UNSURE IF IT WAS A MINI-STROKE OR AN INFECTION  . Sleep apnea    uses cpap  . Stroke [facility](HCC)    mini stroke 2002  . Torn rotator cuff    left     Assessment/Plan:   1 Day Post-Op Procedure(s) (LRB): TOTAL KNEE ARTHROPLASTY (Right) Active Problems:   Primary osteoarthritis of right knee  Estimated body mass index is 28.48 kg/m as calculated from the following:   Height as of this encounter: 5\' 10"  (1.778 m).   Weight as of this encounter: 90 kg (198 lb 8 oz). Advance diet Up with therapy  Needs BM Recheck labs in the am CM to assist with discharge  DVT Prophylaxis - Lovenox, Foot Pumps and TED hose Weight-Bearing as tolerated to right leg   T. Cranston Neighborhris Gaines, PA-C [facility] 10/08/2016, 8:06 AM

## 2016-10-08 NOTE — Progress Notes (Signed)
Foley d/cd' at 0640

## 2016-10-08 NOTE — Progress Notes (Signed)
Physical Therapy Treatment Patient Details Name: Adam CoolsDavid E Ciavarella MRN: [id number] DOB: May 13, 1951 Today's Date: 10/08/2016    History of Present Illness Pt. is a 65 y.o. male who was admitted to [facility] for a right TKR. Pt. underwent a left TKR apprximately 2 months ago.    PT Comments    Pt progressing extremely well this date with ambulation using RW with reciprocal gait pattern for 300'.  Pt performed seated and supine therex without adverse effects.  AROM -5-92 degrees.  Wife at bedside.  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  Cont with POC.   Follow Up Recommendations  Home health PT     Equipment Recommendations  None recommended by PT    Recommendations for Other Services       Precautions / Restrictions Precautions Precautions: Fall;Knee Restrictions Weight Bearing Restrictions: Yes Other Position/Activity Restrictions: WBAT    Mobility  Bed Mobility Overal bed mobility: Modified Independent Bed Mobility: Supine to Sit     Supine to sit: Modified independent (Device/Increase time)     General bed mobility comments: HOB flat exiting to L side without rails with good pacing and sequencing.  Transfers Overall transfer level: Needs assistance Equipment used: Rolling walker (2 wheeled) Transfers: Sit to/from Stand Sit to Stand: Supervision         General transfer comment: Verbal cues for hand placemnt when lowering into sitting.  Ambulation/Gait Ambulation/Gait assistance: Supervision Ambulation Distance (Feet): 300 Feet Assistive device: Rolling walker (2 wheeled) Gait Pattern/deviations: Step-through pattern;Decreased step length - left;Decreased stance time - right     General Gait Details: Pt with good endurance and pain control during ambulation using step through gait pattern.   Stairs            Wheelchair Mobility    Modified Rankin (Stroke Patients Only)       Balance                                     Cognition Arousal/Alertness: Awake/alert Behavior During Therapy: WFL for tasks assessed/performed Overall Cognitive Status: Within Functional Limits for tasks assessed                      Exercises Total Joint Exercises Ankle Circles/Pumps: Strengthening;Both;10 reps;Seated Quad Sets: Strengthening;Both;10 reps;Supine Short Arc Quad: Strengthening;Right;10 reps;Seated Heel Slides: Strengthening;Right;10 reps;Supine Straight Leg Raises: Strengthening;Right;10 reps;Supine    General Comments General comments (skin integrity, edema, etc.): surgical bandage intact      Pertinent Vitals/Pain Pain Assessment: No/denies pain Pain Score: 5  Pain Location: right knee Pain Descriptors / Indicators: Aching;Tightness Pain Intervention(s): Limited activity within patient's tolerance;Monitored during session    Home Living Family/patient expects to be discharged to:: Private residence Living Arrangements: Spouse/significant other Available Help at Discharge: Family Type of Home: House Home Access: Stairs to enter Entrance Stairs-Rails: Left Home Layout: One level Home Equipment: Environmental consultantWalker - 2 wheels;Shower seat;Cane - single point      Prior Function Level of Independence: Independent      Comments: Independent with ADLs/IADLs, drives. full community ambulator without assistive device   PT Goals (current goals can now be found in the care plan section) Acute Rehab PT Goals Patient Stated Goal: To get back to normal without pain. PT Goal Formulation: With patient Time For Goal Achievement: 10/21/16 Potential to Achieve Goals: Good Progress towards PT goals: Progressing toward goals    Frequency  BID      PT Plan Current plan remains appropriate    Co-evaluation             End of Session Equipment Utilized During Treatment: Gait belt Activity Tolerance: Patient tolerated treatment well Patient left: in chair;with call bell/phone within reach;with chair  alarm set;with SCD's reapplied     Time: 4098-11911013-1042 PT Time Calculation (min) (ACUTE ONLY): 29 min  Charges:  $Gait Training: 8-22 mins $Therapeutic Exercise: 8-22 mins                    G Codes:      Perle Brickhouse A Quamaine Webb, PT 10/08/2016, 11:07 AM

## 2016-10-08 NOTE — Discharge Summary (Signed)
Physician Discharge Summary  Patient ID: Adam Villa MRN: [id number] DOB/AGE: 26-Apr-1951 65 y.o.  Admit date: 10/07/2016 Discharge date: 10/08/2016  Admission Diagnoses:  primary osteoarthritis of both knees   Discharge Diagnoses: Patient Active Problem List   Diagnosis Date Noted  . Primary osteoarthritis of right knee 10/07/2016  . Primary localized osteoarthritis of left knee 08/12/2016    Past Medical History:  Diagnosis Date  . Arthritis   . Elevated lipids   . GERD (gastroesophageal reflux disease)   . Hypertension   . Mini stroke (HCC)    POSSIBLE-PT WAS UNSURE IF IT WAS A MINI-STROKE OR AN INFECTION  . Sleep apnea    uses cpap  . Stroke [facility](HCC)    mini stroke 2002  . Torn rotator cuff    left     Transfusion: none   Consultants (if any):   Discharged Condition: Improved  Hospital Course: Adam Villa is an 65 y.o. male who was admitted 10/07/2016 with a diagnosis of <principal problem not specified> and went to the operating room on 10/07/2016 and underwent the above named procedures.    Surgeries: Procedure(s): TOTAL KNEE ARTHROPLASTY on 10/07/2016 Patient tolerated the surgery well. Taken to PACU where she was stabilized and then transferred to the orthopedic floor.  Started on Lovenox 30 q 12 hrs. Foot pumps applied bilaterally at 80 mm. Heels elevated on bed with rolled towels. No evidence of DVT. Negative Homan. Physical therapy started on day #1 for gait training and transfer. OT started day #1 for ADL and assisted devices.  Patient's foley was d/c on day #1. Patient's IV was d/c on day #1.  On post op day #1 patient was stable and ready for discharge to home with HHPT.  Implants: Medacta GMK Sphere 4 femur, 4 tibia , 10 mm insert with short stem and 3 patella, all cemented  He was given perioperative antibiotics:  Anti-infectives    Start     Dose/Rate Route Frequency Ordered Stop   10/07/16 1230  clindamycin (CLEOCIN) IVPB 900 mg     900  mg 100 mL/hr over 30 Minutes Intravenous Every 6 hours 10/07/16 1116 10/08/16 0026   10/07/16 0551  clindamycin (CLEOCIN) 900 MG/50ML IVPB    Comments:  KENNEDY, ASHLEY: cabinet override      10/07/16 0551 10/07/16 0812   10/06/16 2315  clindamycin (CLEOCIN) IVPB 900 mg     900 mg 100 mL/hr over 30 Minutes Intravenous  Once 10/06/16 2311 10/07/16 0827    .  He was given sequential compression devices, early ambulation, and lovenox for DVT prophylaxis.  He benefited maximally from the hospital stay and there were no complications.    Recent vital signs:  Vitals:   10/08/16 1122 10/08/16 1607  BP: (!) 115/56 (!) 110/54  Pulse: 60 (!) 58  Resp: 19 19  Temp: 98.2 F (36.8 C) 98.2 F (36.8 C)    Recent laboratory studies:  Lab Results  Component Value Date   HGB 12.7 (L) 10/08/2016   HGB 13.0 10/07/2016   HGB 13.9 09/25/2016   Lab Results  Component Value Date   WBC 12.5 (H) 10/08/2016   PLT 235 10/08/2016   Lab Results  Component Value Date   INR 0.92 09/25/2016   Lab Results  Component Value Date   NA 135 10/08/2016   K 3.9 10/08/2016   CL 106 10/08/2016   CO2 24 10/08/2016   BUN 17 10/08/2016   CREATININE 0.71 10/08/2016   GLUCOSE 145 (  H) 10/08/2016    Discharge Medications:     Medication List    TAKE these medications   amLODipine 10 MG tablet Commonly known as:  NORVASC Take 10 mg by mouth at bedtime.   aspirin 325 MG tablet Take 650 mg by mouth daily.   atorvastatin 40 MG tablet Commonly known as:  LIPITOR Take 40 mg by mouth every morning.   diphenhydramine-acetaminophen 25-500 MG Tabs tablet Commonly known as:  TYLENOL PM Take 2 tablets by mouth at bedtime as needed.   enoxaparin 40 MG/0.4ML injection Commonly known as:  LOVENOX Inject 0.4 mLs (40 mg total) into the skin daily.   Fish Oil 1200 MG Caps Take 2 capsules by mouth daily.   methocarbamol 500 MG tablet Commonly known as:  ROBAXIN Take 1 tablet (500 mg total) by mouth  every 8 (eight) hours as needed for muscle spasms.   oxyCODONE 5 MG immediate release tablet Commonly known as:  Oxy IR/ROXICODONE Take 1-2 tablets (5-10 mg total) by mouth every 4 (four) hours as needed for breakthrough pain. What changed:  Another medication with the same name was added. Make sure you understand how and when to take each.   oxyCODONE 5 MG immediate release tablet Commonly known as:  Oxy IR/ROXICODONE Take 1-2 tablets (5-10 mg total) by mouth every 3 (three) hours as needed for breakthrough pain. What changed:  You were already taking a medication with the same name, and this prescription was added. Make sure you understand how and when to take each.   predniSONE 1 MG tablet Commonly known as:  DELTASONE Take 2 mg by mouth daily with breakfast.   Vitamin D3 2000 units Tabs Take 1 tablet by mouth daily.       Diagnostic Studies: Dg Knee 1-2 Views Right  Result Date: 10/07/2016 CLINICAL DATA:  Post RIGHT knee replacement EXAM: RIGHT KNEE - 1-2 VIEW COMPARISON:  Portable exam 1042 hours compared to 07/14/2016 CT knee FINDINGS: Mild osseous demineralization. Components of a RIGHT knee prosthesis are identified. No fracture, dislocation, bone destruction, or periprosthetic lucency. Expected soft tissue changes anteriorly. IMPRESSION: RIGHT knee prosthesis without acute abnormalities. Electronically Signed   By: Ulyses SouthwardMark  Boles M.D.   On: 10/07/2016 11:10    Disposition: 06-Home-Health Care Svc    Follow-up Information    MENZ,MICHAEL, MD Follow up in 2 week(s).   Specialty:  Orthopedic Surgery Contact information: [address] [facility] [place] [address] [phone]            Signed: Patience MuscaGAINES, THOMAS CHRISTOPHER 10/08/2016, 4:58 PM

## 2016-10-08 NOTE — Progress Notes (Signed)
Physical Therapy Treatment Patient Details Name: Cline CoolsDavid E Skalla MRN: [id number] DOB: December 02, 1950 Today's Date: 10/08/2016    History of Present Illness Pt. is a 65 y.o. male who was [facility] right [facility]. Pt. underwent a left TKR apprximately 2 months ago.    PT Comments    Pt continues to progress towards PT goals with ambulation and transfers, requiring less assistance.  Pt able to go up/down 4 steps x2 using L rails with good safety awareness and balance.  Pt without increased pain after am treatment session.  Cont with POC.   Follow Up Recommendations  Home health PT     Equipment Recommendations  None recommended by PT    Recommendations for Other Services       Precautions / Restrictions Precautions Precautions: Fall;Knee Restrictions Other Position/Activity Restrictions: WBAT    Mobility  Bed Mobility Overal bed mobility: Modified Independent Bed Mobility: Supine to Sit     Supine to sit: Modified independent (Device/Increase time)     General bed mobility comments: Entering bed from L side with Mod I; HOB flat with no difficulties  Transfers Overall transfer level: Modified independent Equipment used: Rolling walker (2 wheeled) Transfers: Sit to/from Stand Sit to Stand: Modified independent (Device/Increase time)         General transfer comment: Sit<>stand with good body mechanics and hand placment.  Ambulation/Gait Ambulation/Gait assistance: Modified independent (Device/Increase time) Ambulation Distance (Feet): 300 Feet Assistive device: Rolling walker (2 wheeled) Gait Pattern/deviations: Step-through pattern;Decreased step length - left;Decreased stance time - right     General Gait Details: Pt with good endurance and pain control during ambulation using step through gait pattern.   Stairs Stairs: Yes   Stair Management: One rail Left;Step to pattern Number of Stairs: 8 General stair comments: Independent recall of stepping  pattern.  Wheelchair Mobility    Modified Rankin (Stroke Patients Only)       Balance                                    Cognition Arousal/Alertness: Awake/alert Behavior During Therapy: WFL for tasks assessed/performed Overall Cognitive Status: Within Functional Limits for tasks assessed                      Exercises Total Joint Exercises Ankle Circles/Pumps: Strengthening;Both;10 reps;Seated Quad Sets: Strengthening;Both;10 reps;Supine Short Arc Quad: Strengthening;Right;10 reps;Seated Heel Slides: Strengthening;Right;10 reps;Supine Hip ABduction/ADduction: Strengthening;Right;10 reps;Supine Straight Leg Raises: Strengthening;Right;10 reps;Supine Goniometric ROM: -5-92    General Comments General comments (skin integrity, edema, etc.): surgical bandage intact      Pertinent Vitals/Pain Pain Assessment: No/denies pain Pain Location: right knee Pain Descriptors / Indicators: Aching;Tightness Pain Intervention(s): Limited activity within patient's tolerance    Home Living                      Prior Function            PT Goals (current goals can now be found in the care plan section) Acute Rehab PT Goals Patient Stated Goal: To get back to normal without pain. PT Goal Formulation: With patient Time For Goal Achievement: 10/21/16 Potential to Achieve Goals: Good Progress towards PT goals: Progressing toward goals    Frequency    BID      PT Plan Current plan remains appropriate    Co-evaluation  End of Session Equipment Utilized During Treatment: Gait belt Activity Tolerance: Patient tolerated treatment well Patient left: with call bell/phone within reach;with chair alarm set;with SCD's reapplied;in bed     Time: 1352-1419 PT Time Calculation (min) (ACUTE ONLY): 27 min  Charges:  $Gait Training: 8-22 mins $Therapeutic Exercise: 8-22 mins                    G Codes:      Eldoris Beiser A Damire Remedios,  GL 10/08/2016, 2:38 PM

## 2016-10-08 NOTE — Progress Notes (Signed)
Shift assessment completed at 0810. Pt is awake, alert and oriented, in no distress. Skin is warm and ry, lungs are clear bilat, Hr is regular, abdomen is soft, bs heard. Pt had foley removed prior to 0700 and hnv yet. Ppp, R knee has dressing dry and intact, polar care intact, no drainage noted. PIV #20 to L arm is saline locked at this time. Pt rating pain at 3/10, R foot is in bone foam, pt stated that this si why he has most of his pain, from the bone foam. Since assessment, Dr. Neomia GlassMenz's office has rounded on pt, pt received MOM as part of bowel regimen with am meds. Wife at bedside, call bell in reach.

## 2016-10-08 NOTE — Care Management Note (Addendum)
Case Management Note  Patient Details  Name: Adam Villa MRN: [id number] Date of Birth: 1951/06/28  Subjective/Objective:  Met with wife at bedside. Patient is in the bathroom. He gave permission for [facility] to speak with wife. She will be patient's caregiver at discharge. PCP is Frazier Richards.  Patient just had a knee replacement in Oct. They prefer to use [facility] as before. He has a walker.   Pharmacy: [facility]. [place]. [phone]. Called Lovenox 40 mg # 14, no refills.              Action/Plan: Referral to [facility].   Expected Discharge Date:  10/09/16               Expected Discharge Plan:  [place]  In-House Referral:     Discharge planning Services  CM Consult  Post Acute Care Choice:  Home Health Choice offered to:  Spouse  DME Arranged:    DME Agency:     HH Arranged:  PT [place]:  [facility] (formerly [employer])  Status of Service:  In process, will continue to follow  If discussed at Long Length of Stay Meetings, dates discussed:    Additional Comments:  Jolly Mango, RN 10/08/2016, 11:57 AM

## 2016-10-08 NOTE — Progress Notes (Signed)
Clinical Child psychotherapistocial Worker (CSW) received SNF consult. PT recommended home health. Patient discharged home today. Please reconsult if future social work needs arise. CSW signing off.   Baker Hughes IncorporatedBailey Weber Monnier, LCSW [phone]

## 2016-10-08 NOTE — Evaluation (Signed)
Occupational Therapy Evaluation Patient Details Name: Adam Villa MRN: [id number] DOB: [birth year] Today's Date: 10/08/2016    History of Present Illness Pt. is a 65 y.o. male who [facility]. Pt. underwent a left TKR apprximately 2 months ago.   Clinical Impression   Pt. is a 65 y.o. male who was [facility]. Pt. recently had a left TKR. Pt. reports not needing any A/E following his previous surgery, and anticipates not needing any after this, as pt. feels better following this surgery. Pt. plans to return home with his wife at discharge. No further OT services are warranted at this time.     Follow Up Recommendations  No OT follow up    Equipment Recommendations       Recommendations for Other Services       Precautions / Restrictions Restrictions Weight Bearing Restrictions: Yes                                                     ADL Overall ADL's : Needs assistance/impaired                                       General ADL Comments: Pt. reporsts being familiar with the A/E from his previous knee surgery 2 months ago. Pt. reports not needing any equipment then, and anticipates not needing any now, Pt. reports he feels as though he is doing better with this surgery.     Vision     Perception     Praxis      Pertinent Vitals/Pain Pain Assessment: 0-10 Pain Score: 5  Pain Location: right knee Pain Intervention(s): Limited activity within patient's tolerance     Hand Dominance Right   Extremity/Trunk Assessment Upper Extremity Assessment Upper Extremity Assessment: Overall WFL for tasks assessed           Communication Communication Communication: No difficulties   Cognition Arousal/Alertness: Awake/alert Behavior During Therapy: WFL for tasks assessed/performed Overall Cognitive Status: Within Functional Limits for tasks assessed                      General Comments       Exercises       Shoulder Instructions      Home Living Family/patient expects to be discharged to:: Private residence Living Arrangements: Spouse/significant other Available Help at Discharge: Family Type of Home: House Home Access: Stairs to enter Secretary/administratorntrance Stairs-Number of Steps: 5 Entrance Stairs-Rails: Left Home Layout: One level     Bathroom Shower/Tub: Walk-in shower;Door   Foot LockerBathroom Toilet: Standard Bathroom Accessibility: Yes   Home Equipment: Environmental consultantWalker - 2 wheels;Shower seat;Cane - single point          Prior Functioning/Environment Level of Independence: Independent        Comments: Independent with ADLs/IADLs, drives. full community ambulator without assistive device        OT Problem List:     OT Treatment/Interventions:      OT Goals(Current goals can be found in the care plan section) Acute Rehab OT Goals Patient Stated Goal: To get back to normal without pain. OT Goal Formulation: With patient Potential to Achieve Goals: Good  OT Frequency:  Barriers to D/C:            Co-evaluation              End of Session    Activity Tolerance: Patient tolerated treatment well Patient left: in bed;with call bell/phone within reach;with family/visitor present   Time: 1610-96040920-0935 OT Time Calculation (min): 15 min Charges:  OT General Charges $OT Visit: 1 Procedure OT Evaluation $OT Eval Moderate Complexity: 1 Procedure G-Codes:    Olegario MessierElaine Deno Sida 10/08/2016, 9:50 AM

## 2016-10-14 NOTE — H&P (Signed)
Adam Villa, Thomas Christopher, [place] - 09/29/2016 9:15 AM EST Frutoso ChaseMartin, Crystal Nacole, CMA - 09/29/2016 9:15 AM EST   Adam Villa, Thomas Christopher, PA - 09/29/2016 9:15 AM EST Formatting of this note may be different from the original. Chief Complaint: Chief Complaint  Patient presents with  . Pre-op Exam  h&P R TKA 10/07/16   Cline CoolsDavid E Lyne is a 65 y.o. male who presents today for history and physical for right total knee arthroplasty with Dr. Rosita KeaMenz on 10/07/2016. Patient has near bone-on-bone in the medial compartment of the right knee and is set progressive right knee pain. He said several knee effusions requiring aspiration. Pain interferes with his activities of daily living. He has had minimal improvement with cortisone and Visco supplementation. Patient recently underwent left total knee arthroplasty and is doing well.  Past Medical History: Past Medical History:  Diagnosis Date  . Hearing loss  right  . Hyperglycemia, unspecified  . Hypertension  . Osteoarthritis  Bilateral knees  . PMR (polymyalgia rheumatica) (CMS-HCC)  . Sleep apnea  questionable  . Stroke (CMS-HCC)   Past Surgical History: Past Surgical History:  Procedure Laterality Date  . ARTHROPLASTY TOTAL KNEE Left 08/12/2016  Dr.Alyviah Crandle  . BACK SURGERY  . extendive arthroscopic debridement, arthroscopic decompression, mini-open rotator cuff repair, and mini-open biceps tenodesis, left shoulder Left 08/30/2015  Dr.Poggi  . KNEE ARTHROSCOPY Right 10/23/2015  . Left Knee arthroscopic medial meniscectomy, excision of plica band. 03-01-2010  . Lumbar spine surgery  . R Knee Arthroscopy 10/23/15 Right 10/23/2015  Dr Kennedy BuckerMichael Akeira Lahm  . Right ear surgery  . TONSILLECTOMY   Past Family History: Family History  Problem Relation Age of Onset  . Diabetes type II Mother  . Diabetes type II Father  . Lymphoma Son  . Arthritis Other   Medications: Current Outpatient Prescriptions Ordered in Epic  Medication Sig Dispense Refill   . amLODIPine (NORVASC) 10 MG tablet TAKE 1 TABLET BY MOUTH EVERY DAY 90 tablet 3  . atorvastatin (LIPITOR) 40 MG tablet TAKE 1 TABLET(40 MG) BY MOUTH EVERY DAY 90 tablet 0  . azithromycin (ZITHROMAX) 250 MG tablet Take 2 tablets (500mg ) by mouth on Day 1. Take 1 tablet (250mg ) by mouth on Days 2-5. 6 tablet 0  . cholecalciferol (VITAMIN D3) 2,000 unit tablet Take 2,000 Units by mouth once daily.   . fluticasone (FLONASE) 50 mcg/actuation nasal spray Place 2 sprays into both nostrils once daily. 16 g 1  . OMEGA-3/DHA/EPA/FISH OIL (FISH OIL-OMEGA-3 FATTY ACIDS) 300-1,000 mg capsule Take 2 g by mouth once daily.  . predniSONE (DELTASONE) 1 MG tablet Take 2 tablets (2 mg total) by mouth as directed. 60 tablet 1   No current Epic-ordered facility-administered medications on file.   Allergies: Allergies  Allergen Reactions  . Penicillins Rash    Review of Systems:  A comprehensive 14 point ROS was performed, reviewed by me today, and the pertinent orthopaedic findings are documented in the HPI.  Exam: BP 150/82 (BP Location: Left upper arm, Patient Position: Sitting, BP Cuff Size: Adult)  Ht 177.8 cm (5\' 10" )  Wt 87.5 kg (193 lb)  BMI 27.69 kg/m  General:  Well developed, well nourished, no apparent distress, normal affect, normal gait with no antalgic component.   HEENT: Head normocephalic, atraumatic, PERRL.   Abdomen: Soft, non tender, non distended, Bowel sounds present.  Heart: Examination of the heart reveals regular, rate, and rhythm. There is no murmur noted on ascultation. There is a normal apical pulse.  Lungs: Lungs are  clear to auscultation. There is no wheeze, rhonchi, or crackles. There is normal expansion of bilateral chest walls.   Right lower Extremities: Examination of the right lower extremity reveals no bony abnormality, no edema, mild effusion and no ecchymosis. There is no valgus or varus abnormality. The patient is non-tender along the lateral joint line,  and is tender along the medial joint line. Mild crepitus with knee range of motion. 0-115 range of motion.  Vascular: The patient has a negative Denna HaggardHomans' test bilaterally. The patient had a normal dorsalis pedis and posterior tibial pulse. There is normal skin warmth. There is normal capillary refill bilaterally.   Neurologic: The patient has a negative straight leg raise. The patient has normal muscle strength testing for the quadriceps, calves, ankle dorsiflexion, ankle plantarflexion, and extensor hallicus longus. The patient has sensation that is intact to light touch. The deep tendon reflexes are normal at the patella and achilles. No clonus is noted.   Imaging: X-rays of the right knee reviewed by me today show patient has significant joint space narrowing with slight varus deformity and bone-on-bone in the medial compartment. No evidence of acute bony abnormality.  Impression: Primary osteoarthritis of right knee [M17.11] Primary osteoarthritis of right knee (primary encounter diagnosis)  Plan:  1. Risks, benefits, complications of a right total knee arthroplasty were discussed with the patient, patient has agreed and consented to procedure with Dr. Rosita KeaMenz on 10/07/2016  This note was generated in part with voice recognition software and I apologize for any typographical errors that were not detected and corrected.  Adam Muscahomas Christopher Gaines MPA-C

## 2017-06-18 IMAGING — DX DG KNEE 1-2V*L*
2 series · 2 of 2 positions shown · non-contrast
Comparison: CT 07/14/2016

CLINICAL DATA: Total knee arthroplasty

EXAM:
LEFT KNEE - 1-2 VIEW

[knee ap]
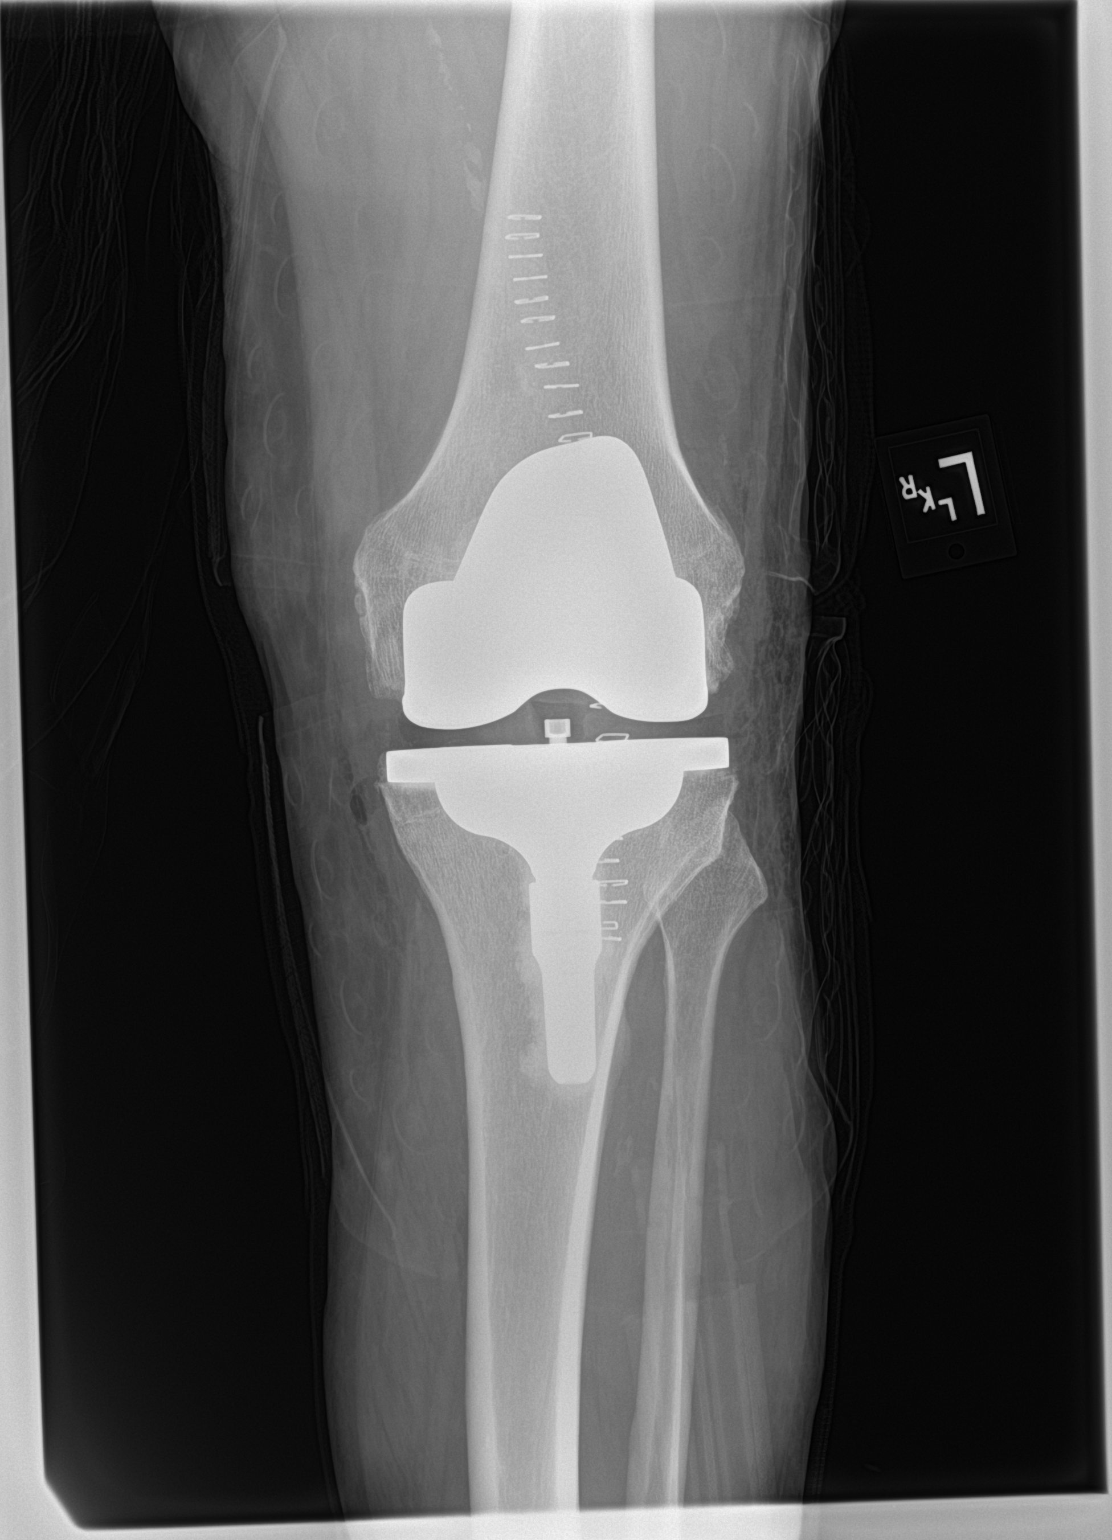

[knee lat]
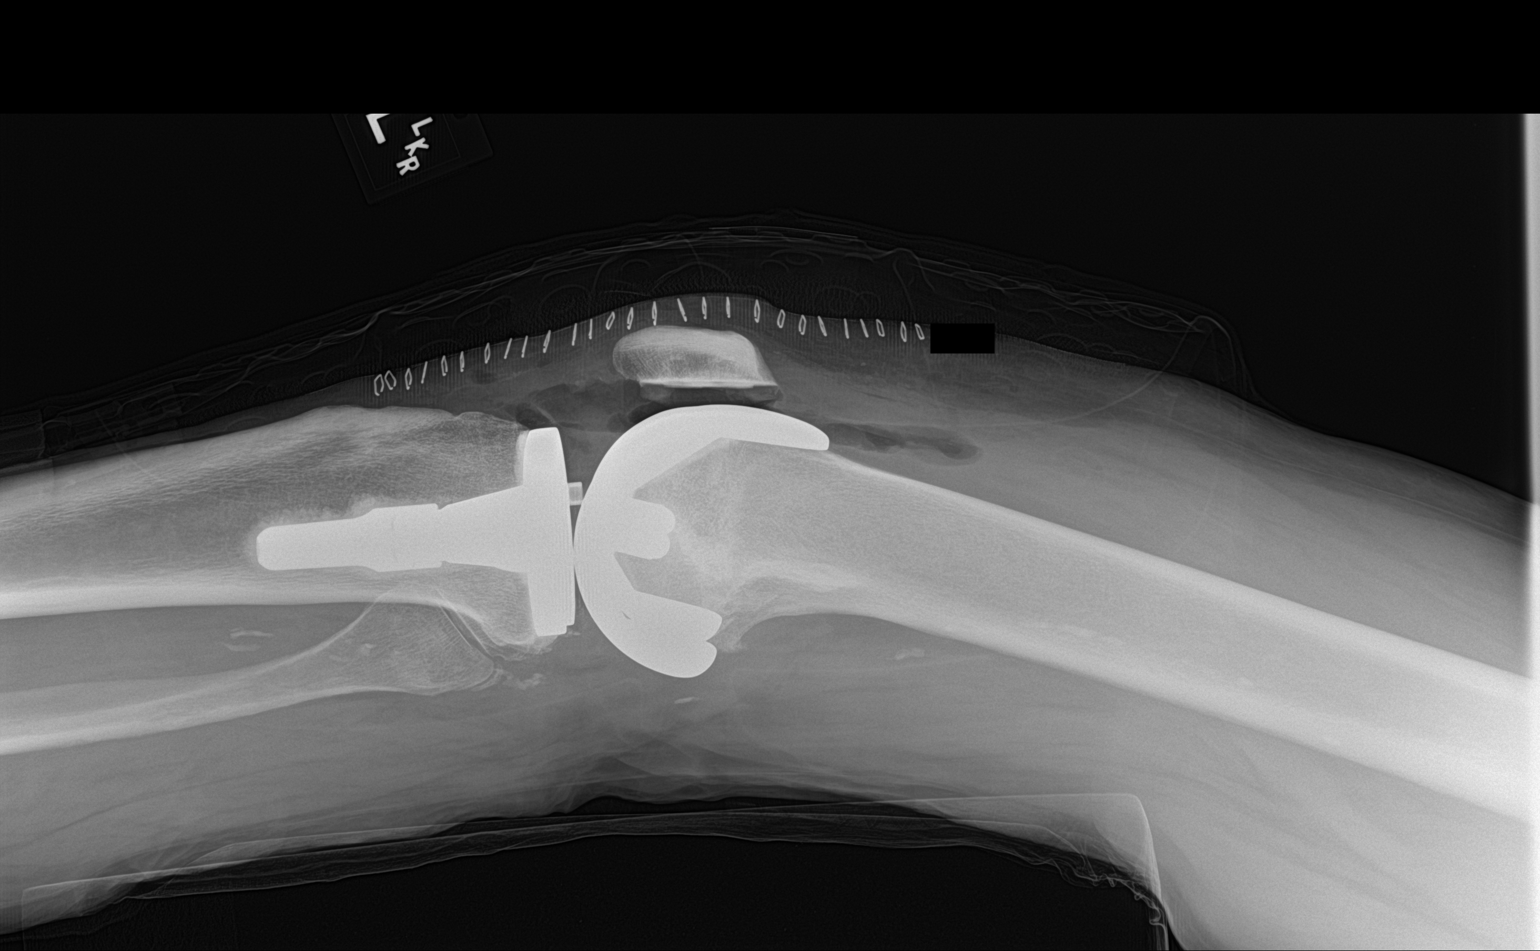

[2 of 2 positions shown; findings below may reference images not displayed]

FINDINGS: Total knee arthroplasty without periprosthetic fracture. Prostheses
appear well seated. Expected soft tissue gas and swelling.
Atherosclerotic calcification.
IMPRESSION: No acute finding after total knee arthroplasty.

## 2019-03-03 ENCOUNTER — Encounter: Payer: Self-pay | Admitting: General Surgery

## 2019-03-07 ENCOUNTER — Other Ambulatory Visit: Payer: Self-pay

## 2019-03-07 ENCOUNTER — Encounter: Payer: Self-pay | Admitting: Internal Medicine

## 2019-03-07 ENCOUNTER — Ambulatory Visit (INDEPENDENT_AMBULATORY_CARE_PROVIDER_SITE_OTHER): Payer: Medicare Other | Admitting: Internal Medicine

## 2019-03-07 VITALS — Ht 67.0 in | Wt 193.0 lb

## 2019-03-07 DIAGNOSIS — R6881 Early satiety: Secondary | ICD-10-CM | POA: Diagnosis not present

## 2019-03-07 DIAGNOSIS — R634 Abnormal weight loss: Secondary | ICD-10-CM

## 2019-03-07 DIAGNOSIS — R110 Nausea: Secondary | ICD-10-CM

## 2019-03-07 MED ORDER — OMEPRAZOLE 40 MG PO CPDR: 40.0000 mg | DELAYED_RELEASE_CAPSULE | Freq: Every day | ORAL | 3 refills | Status: DC

## 2019-03-07 NOTE — Addendum Note (Signed)
Addended by: Zachary George on: 03/07/2019 11:26 AM   Modules accepted: Orders

## 2019-03-07 NOTE — Patient Instructions (Addendum)
If you are age 70 or older, your body mass index should be between 23-30. Your Body mass index is 30.23 kg/m. If this is out of the aforementioned range listed, please consider follow up with your Primary Care Provider.  If you are age 51 or younger, your body mass index should be between 19-25. Your Body mass index is 30.23 kg/m. If this is out of the aformentioned range listed, please consider follow up with your Primary Care Provider.   You have been scheduled for an endoscopy. Please follow written instructions given to you at your visit today. If you use inhalers (even only as needed), please bring them with you on the day of your procedure. Your physician has requested that you go to www.startemmi.com and enter the access code given to you at your visit today. This web site gives a general overview about your procedure. However, you should still follow specific instructions given to you by our office regarding your preparation for the procedure.  We have sent the following medications to your pharmacy for you to pick up at your convenience:  Omeprazole  Continue Zofran as needed.  You have been scheduled for an abdominal ultrasound at [facility] on 03/11/19 at 8:45 am. Please arrive prior to your appointment for registration. Make certain not to have anything to eat or drink after midnight the day prior to your appointment. Should you need to reschedule your appointment, please contact radiology at [phone]. This test typically takes about 30 minutes to perform.  Thank you for choosing me and [facility].   Yancey Flemings, MD

## 2019-03-07 NOTE — Progress Notes (Signed)
HISTORY OF PRESENT ILLNESS:  Adam Villa is a 68 y.o. adult who is referred by his primary provider for a telehealth visit consultation during the coronavirus pandemic for evaluation of problems with nausea and weight loss.  I saw the patient on one occasion November 20, 2009 for screening colonoscopy.  The examination was normal except for a diminutive descending colon polyp which was removed and found to be hyperplastic.  Follow-up in 10 years recommended.  Patient tells me that he was in his usual state of health until about 4 weeks ago when he developed problems with nausea that lasted throughout the course of the day and it was exacerbated after eating.  He denies classic reflux symptoms or abdominal pain.  Does report decreased appetite with early satiety and weight loss of 10 pounds.  Also increased intestinal gas.  No change in bowel habits or GI bleeding.  No change in sleeping pattern.  He also reports generalized weakness.  Was seen by his primary care provider last week and was screened for COVID-19.  This was negative.  He was prescribed Zofran  REVIEW OF SYSTEMS:  All non-GI ROS negative unless otherwise stated in the HPI except for urinary frequency  Past Medical History:  Diagnosis Date  . Arthritis   . Elevated lipids   . GERD (gastroesophageal reflux disease)   . Hypertension   . Mini stroke (HCC)    POSSIBLE-PT WAS UNSURE IF IT WAS A MINI-STROKE OR AN INFECTION  . Sleep apnea    uses cpap  . Stroke [facility]    mini stroke 2002  . Torn rotator cuff    left    Past Surgical History:  Procedure Laterality Date  . BACK SURGERY  1970   lower  . cyst from back    . JOINT REPLACEMENT Left 07/2016  . KNEE ARTHROSCOPY Left   . KNEE ARTHROSCOPY Right 10/23/2015   Procedure: ARTHROSCOPY KNEE, PARTIAL MEDIAL MENISECTOMY, PARTIAL SYNOVECTOMY;  Surgeon: Kennedy Bucker, MD;  Location: [facility] ORS;  Service: Orthopedics;  Laterality: Right;  . SHOULDER ARTHROSCOPY WITH OPEN ROTATOR CUFF  REPAIR Left 08/30/2015   Procedure: SHOULDER ARTHROSCOPY WITH OPEN ROTATOR CUFF REPAIR and decompression, debridement , biceps tenodesis;  Surgeon: Christena Flake, MD;  Location: [facility] ORS;  Service: Orthopedics;  Laterality: Left;  . TONSILLECTOMY    . TOTAL KNEE ARTHROPLASTY Left 08/12/2016   Procedure: TOTAL KNEE ARTHROPLASTY;  Surgeon: Kennedy Bucker, MD;  Location: [facility] ORS;  Service: Orthopedics;  Laterality: Left;  . TOTAL KNEE ARTHROPLASTY Right 10/07/2016   Procedure: TOTAL KNEE ARTHROPLASTY;  Surgeon: Kennedy Bucker, MD;  Location: [facility] ORS;  Service: Orthopedics;  Laterality: Right;    Social History Adam Villa  reports that she quit smoking about 18 years ago. She has never used smokeless tobacco. She reports current alcohol use of about 2.0 standard drinks of alcohol per week. She reports that she does not use drugs.  family history is not on file.  Allergies  Allergen Reactions  . Penicillins Rash    REACTION: rash       PHYSICAL EXAMINATION: No physical examination with telehealth visit  ASSESSMENT:  1.  4-week history of postprandial nausea, early satiety, weight loss.  Rule out ulcer disease, rule out GERD, rule out malignancy, biliary cause for gastroparesis 2.  Colonoscopy 2003 and 2011 with hyperplastic polyps  PLAN:  1.  SCHEDULE EGD and LEC this week.The nature of the procedure, as well as the risks, benefits, and alternatives were carefully  and thoroughly reviewed with the patient. Ample time for discussion and questions allowed. The patient understood, was satisfied, and agreed to proceed. 2.  SCHEDULE abdominal ultrasound "early satiety and weight loss, evaluate gallbladder and pancreas) 3.  PRESCRIBE omeprazole 40 mg daily; #30; 3 refills 4.  Depending upon the results of the above examinations and response to therapy, would consider gastric emptying scan 5.  Continue Zofran as needed  This telehealth medicine visit during the coronavirus pandemic was  scheduled by the patient consented for by the patient who was in his home while I was in my office during the encounter.  He understands it may be an associated professional charge for this service

## 2019-03-08 ENCOUNTER — Telehealth: Payer: Self-pay | Admitting: *Deleted

## 2019-03-08 NOTE — Telephone Encounter (Signed)
Covid-19 travel screening questions  Have you traveled in the last 14 days?no If yes where?  Do you now or have you had a fever in the last 14 days?no  Do you have any respiratory symptoms of shortness of breath or cough now or in the last 14 days?no  Do you have a medical history of Congestive Heart Failure?  Do you have a medical history of lung disease?  Do you have any family members or close contacts with diagnosed or suspected Covid-19?no  Pt aware of care partner policy and will bring a mask with him if he has one available. Sm

## 2019-03-09 ENCOUNTER — Other Ambulatory Visit: Payer: Self-pay

## 2019-03-09 ENCOUNTER — Ambulatory Visit (AMBULATORY_SURGERY_CENTER): Payer: Medicare Other | Admitting: Internal Medicine

## 2019-03-09 ENCOUNTER — Encounter: Payer: Self-pay | Admitting: Internal Medicine

## 2019-03-09 VITALS — BP 121/66 | HR 70 | Temp 98.5°F | Resp 26 | Ht 67.0 in | Wt 193.0 lb

## 2019-03-09 DIAGNOSIS — K222 Esophageal obstruction: Secondary | ICD-10-CM

## 2019-03-09 DIAGNOSIS — K2970 Gastritis, unspecified, without bleeding: Secondary | ICD-10-CM

## 2019-03-09 DIAGNOSIS — R6881 Early satiety: Secondary | ICD-10-CM

## 2019-03-09 DIAGNOSIS — K449 Diaphragmatic hernia without obstruction or gangrene: Secondary | ICD-10-CM

## 2019-03-09 DIAGNOSIS — K259 Gastric ulcer, unspecified as acute or chronic, without hemorrhage or perforation: Secondary | ICD-10-CM | POA: Diagnosis not present

## 2019-03-09 DIAGNOSIS — R110 Nausea: Secondary | ICD-10-CM

## 2019-03-09 DIAGNOSIS — R634 Abnormal weight loss: Secondary | ICD-10-CM

## 2019-03-09 MED ORDER — SODIUM CHLORIDE 0.9 % IV SOLN: 500.0000 mL | Freq: Once | INTRAVENOUS | Status: DC

## 2019-03-09 NOTE — Patient Instructions (Signed)
Await biopsy results from Dr. Marina Goodell  Handout on hiatal hernia and reflux given to you   Continue with Ultrasound as scheduled   Routine telemedicine office follow up with Dr Marina Goodell in 3-4 weeks  Continue Pantoprazole 40 mg daily   YOU HAD AN ENDOSCOPIC PROCEDURE TODAY AT THE [facility]:   Refer to the procedure report that was given to you for any specific questions about what was found during the examination.  If the procedure report does not answer your questions, please call your gastroenterologist to clarify.  If you requested that your care partner not be given the details of your procedure findings, then the procedure report has been included in a sealed envelope for you to review at your convenience later.  YOU SHOULD EXPECT: Some feelings of bloating in the abdomen. Passage of more gas than usual.  Walking can help get rid of the air that was put into your GI tract during the procedure and reduce the bloating. If you had a lower endoscopy (such as a colonoscopy or flexible sigmoidoscopy) you may notice spotting of blood in your stool or on the toilet paper. If you underwent a bowel prep for your procedure, you may not have a normal bowel movement for a few days.  Please Note:  You might notice some irritation and congestion in your nose or some drainage.  This is from the oxygen used during your procedure.  There is no need for concern and it should clear up in a day or so.  SYMPTOMS TO REPORT IMMEDIATELY:     Following upper endoscopy (EGD)  Vomiting of blood or coffee ground material  New chest pain or pain under the shoulder blades  Painful or persistently difficult swallowing  New shortness of breath  Fever of 100F or higher  Black, tarry-looking stools  For urgent or emergent issues, a gastroenterologist can be reached at any hour by calling [phone].   DIET:  We do recommend a small meal at first, but then you may proceed to your regular diet.   Drink plenty of fluids but you should avoid alcoholic beverages for 24 hours.  ACTIVITY:  You should plan to take it easy for the rest of today and you should NOT DRIVE or use heavy machinery until tomorrow (because of the sedation medicines used during the test).    FOLLOW UP: Our staff will call the number listed on your records 48-72 hours following your procedure to check on you and address any questions or concerns that you may have regarding the information given to you following your procedure. If we do not reach you, we will leave a message.  We will attempt to reach you two times.  During this call, we will ask if you have developed any symptoms of COVID 19. If you develop any symptoms (for example fever, flu-like symptoms, shortness of breath, cough etc.) before then, please call [phone].  If any biopsies were taken you will be contacted by phone or by letter within the next 1-3 weeks.  Please call us at [phone] if you have not heard about the biopsies in 3 weeks.    SIGNATURES/CONFIDENTIALITY: You and/or your care partner have signed paperwork which will be entered into your electronic medical record.  These signatures attest to the fact that that the information above on your After Visit Summary has been reviewed and is understood.  Full responsibility of the confidentiality of this discharge information lies with you and/or your  care-partner.

## 2019-03-09 NOTE — Progress Notes (Signed)
Called to room to assist during endoscopic procedure.  Patient ID and intended procedure confirmed with present staff. Received instructions for my participation in the procedure from the performing physician.   Sueanne Margarita, Endo Tech Gilmore Laroche, CRNA Eual Fines, RN Procedure room team

## 2019-03-09 NOTE — Op Note (Signed)
[facility] Patient Name: Adam Villa Procedure Date: 03/09/2019 2:52 PM MRN: [id number] Endoscopist: Wilhemina Bonito. Marina Goodell , MD Age: 68 Referring MD:  Date of Birth: Sep 23, 1951 Gender: Male Account #: [id number] Procedure:                Upper GI endoscopy with biopsies Indications:              Early satiety, Nausea with vomiting, Weight loss Medicines:                Monitored Anesthesia Care Procedure:                Pre-Anesthesia Assessment:                           - Prior to the procedure, a History and Physical                            was performed, and patient medications and                            allergies were reviewed. The patient's tolerance of                            previous anesthesia was also reviewed. The risks                            and benefits of the procedure and the sedation                            options and risks were discussed with the patient.                            All questions were answered, and informed consent                            was obtained. Prior Anticoagulants: The patient has                            taken no previous anticoagulant or antiplatelet                            agents. ASA Grade Assessment: II - A patient with                            mild systemic disease. After reviewing the risks                            and benefits, the patient was deemed in                            satisfactory condition to undergo the procedure.                           After obtaining informed consent, the endoscope was  passed under direct vision. Throughout the                            procedure, the patient's blood pressure, pulse, and                            oxygen saturations were monitored continuously. The                            Endoscope was introduced through the mouth, and                            advanced to the second part of duodenum. The upper    GI endoscopy was accomplished without difficulty.                            The patient tolerated the procedure well. Scope In: Scope Out: Findings:                 The esophagus revealed a large caliber distal                            esophageal ring. Otherwise normal esophagus.                           The stomach revealed focal patchy erythema and                            superficial exudate consistent with retching                            gastropathy. Biopsies were taken with a cold                            forceps for histology.                           The examined duodenum was normal.                           The cardia and gastric fundus were normal on                            retroflexion save small hiatal hernia. Complications:            No immediate complications. Estimated Blood Loss:     Estimated blood loss: none. Impression:               1. 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 Small hiatal hernia. Otherwise normal EGD. Recommendation:           1. Continue pantoprazole 40 mg daily  2. Keep plans for ultrasound as scheduled. We will                            contact you with the results                           3. Follow-up biopsies. We will send you a letter                            with the results                           4. Routine telemedicine office follow-up with Dr.                            Marina Goodell in 3 to 4 weeks. Wilhemina Bonito. Marina Goodell, MD 03/09/2019 3:21:28 PM This report has been signed electronically.

## 2019-03-09 NOTE — Progress Notes (Signed)
PT taken to PACU. Monitors in place. VSS. Report given to RN. 

## 2019-03-09 NOTE — Progress Notes (Signed)
Judy Branson took vital signs. 

## 2019-03-11 ENCOUNTER — Telehealth: Payer: Self-pay | Admitting: *Deleted

## 2019-03-11 ENCOUNTER — Other Ambulatory Visit: Payer: Self-pay

## 2019-03-11 ENCOUNTER — Ambulatory Visit
Admission: RE | Admit: 2019-03-11 | Discharge: 2019-03-11 | Disposition: A | Discharge status: Home / Self Care | Payer: Medicare Other | Source: Ambulatory Visit | Attending: Internal Medicine | Admitting: Internal Medicine

## 2019-03-11 DIAGNOSIS — R6881 Early satiety: Secondary | ICD-10-CM

## 2019-03-11 DIAGNOSIS — R634 Abnormal weight loss: Secondary | ICD-10-CM

## 2019-03-11 DIAGNOSIS — R110 Nausea: Secondary | ICD-10-CM

## 2019-03-11 NOTE — Telephone Encounter (Signed)
  Follow up Call-  Call back number 03/09/2019  Post procedure Call Back phone  # [phone]  Permission to leave phone message Yes  Some recent data might be hidden     Patient questions:  Do you have a fever, pain , or abdominal swelling? no Pain Score 0  Have you tolerated food without any problems? yes  Have you been able to return to your normal activities? yes  Do you have any questions about your discharge instructions: Diet   no Medications  no Follow up visit  no  Do you have questions or concerns about your Care? no  Actions: No action needed  1. Have you developed a fever since your procedure? no  2.   Have you had an respiratory symptoms (SOB or cough) since your procedure?no  3.   Have you tested positive for COVID 19 since your procedure no  3.   Have you had any family members/close contacts diagnosed with the COVID 19 since your procedure?  no   If any of these questions are a yes, please inquire if patient has been seen by family doctor and route this note to Laverna Peace, Charity fundraiser.

## 2019-03-14 ENCOUNTER — Encounter: Payer: Self-pay | Admitting: Internal Medicine

## 2019-03-14 ENCOUNTER — Telehealth: Payer: Self-pay | Admitting: Internal Medicine

## 2019-03-14 NOTE — Telephone Encounter (Signed)
OUTSIDE LABORATOR[facility] REVIEWED SUMMARY DATED Mar 11, 2019:  1.  NORMAL CBC WITH HEMOGLOBIN 14.8, WHITE BLOOD CELL COUNT 5.1, PLATELET COUNT 202. 2.  NORMAL COMPREHENSIVE METABOLIC PANEL INCLUDING LIVER TESTS WITH AST 17, ALT 16, ALKALINE PHOSPHATASE 89, TOTAL BILIRUBIN 0.8.  Wilhemina Bonito. Eda Keys., M.D. [facility] Division of Gastroenterology

## 2019-03-15 ENCOUNTER — Telehealth: Payer: Self-pay | Admitting: Internal Medicine

## 2019-03-15 NOTE — Telephone Encounter (Signed)
Pt's PCP recommends CT scan.  Pt would like a call to discuss.

## 2019-03-15 NOTE — Telephone Encounter (Signed)
The pt was advised to follow the recommendations of his primary care and call back if PCP thinks he needs to be seen back by GI The pt has been advised of the information and verbalized understanding.

## 2019-03-22 ENCOUNTER — Telehealth: Payer: Self-pay | Admitting: Internal Medicine

## 2019-03-22 ENCOUNTER — Other Ambulatory Visit: Payer: Self-pay | Admitting: Internal Medicine

## 2019-03-22 DIAGNOSIS — R109 Unspecified abdominal pain: Secondary | ICD-10-CM

## 2019-03-22 NOTE — Telephone Encounter (Signed)
Pt has a telephone visit on 6/5 with Dr. Marina Goodell but is requesting a sooner appt. He stated that he was told that he may have gallstones and may need surgery so pt would like to take care of that asap. Pls call him.

## 2019-03-22 NOTE — Telephone Encounter (Signed)
Discussed with pt that Dr. Marina Goodell is the hospital doc this week and is not in the office at all. There is not a sooner appt available. Pt verbalized understanding.

## 2019-03-28 ENCOUNTER — Other Ambulatory Visit: Payer: Self-pay

## 2019-03-28 ENCOUNTER — Ambulatory Visit
Admission: RE | Admit: 2019-03-28 | Discharge: 2019-03-28 | Disposition: A | Discharge status: Home / Self Care | Payer: Medicare Other | Source: Ambulatory Visit | Attending: Internal Medicine | Admitting: Internal Medicine

## 2019-03-28 DIAGNOSIS — R109 Unspecified abdominal pain: Secondary | ICD-10-CM | POA: Diagnosis not present

## 2019-03-28 MED ORDER — IOHEXOL 300 MG/ML  SOLN
100.0000 mL | Freq: Once | INTRAMUSCULAR | Status: AC | PRN
  Administered 2019-03-28: 100 mL via INTRAVENOUS

## 2019-03-30 ENCOUNTER — Encounter: Payer: Self-pay | Admitting: General Surgery

## 2019-04-01 ENCOUNTER — Other Ambulatory Visit: Payer: Self-pay

## 2019-04-01 ENCOUNTER — Telehealth: Payer: Self-pay

## 2019-04-01 ENCOUNTER — Encounter: Payer: Self-pay | Admitting: Internal Medicine

## 2019-04-01 ENCOUNTER — Ambulatory Visit (INDEPENDENT_AMBULATORY_CARE_PROVIDER_SITE_OTHER): Payer: Medicare Other | Admitting: Internal Medicine

## 2019-04-01 VITALS — Ht 70.0 in | Wt 189.0 lb

## 2019-04-01 DIAGNOSIS — R634 Abnormal weight loss: Secondary | ICD-10-CM | POA: Diagnosis not present

## 2019-04-01 DIAGNOSIS — R6881 Early satiety: Secondary | ICD-10-CM

## 2019-04-01 DIAGNOSIS — R110 Nausea: Secondary | ICD-10-CM

## 2019-04-01 MED ORDER — OMEPRAZOLE 40 MG PO CPDR: 40.0000 mg | DELAYED_RELEASE_CAPSULE | Freq: Every day | ORAL | 3 refills | Status: DC

## 2019-04-01 NOTE — Addendum Note (Signed)
Addended by: Jeanine Luz on: 04/01/2019 02:29 PM   Modules accepted: Orders

## 2019-04-01 NOTE — Patient Instructions (Addendum)
1. You have been scheduled for a gastric emptying scan at [facility] on 04/12/19 at 7:30am.  Please arrive at least 15 minutes prior to your appointment for registration. Please make certain not to have anything to eat or drink after midnight the night before your test. Hold all stomach medications (ex: Zofran, phenergan, Reglan) 48 hours prior to your test. If you need to reschedule your appointment, please contact radiology scheduling at [phone]. _____________________________________________________________________ A gastric-emptying study measures how long it takes for food to move through your stomach. There are several ways to measure stomach emptying. In the most common test, you eat food that contains a small amount of radioactive material. A scanner that detects the movement of the radioactive material is placed over your abdomen to monitor the rate at which food leaves your stomach. This test normally takes about 4 hours to complete. ________________________________________________________   2.  Continue omeprazole 40 mg daily - I went ahead and sent in a refill just in case you needed it.  3.  Follow-up to be determined after the scan completed.  If the scan is normal and symptoms persist and are incapacitating, consider surgical opinion for cholecystectomy.

## 2019-04-01 NOTE — Progress Notes (Signed)
HISTORY OF PRESENT ILLNESS:  Adam Villa is a 68 y.o. adult who was seen recently regarding chronic nausea with early satiety and weight loss.  He presents now for telehealth medicine visit during the coronavirus pandemic for follow-up.  He did undergo upper endoscopy which revealed a large caliber distal esophageal ring and a small nonspecific patchy area of inflammation on the stomach.  Biopsies were negative.  He was placed on omeprazole 40 mg daily.  Follow-up in 3 to 4 weeks recommended.  Abdominal ultrasound revealed cholelithiasis with no other abnormalities.  He has continued with persistent nausea but NO PAIN.  Weight loss has stabilized.  He presented himself locally for ongoing symptoms for which a CT scan of the abdomen and pelvis was obtained.  The examination was normal except for cholelithiasis without complicating features.  Patient tells me that he had been on Zofran 8 mg twice daily which did not help.  He has not noticed any change on PPI.  No new complaints  REVIEW OF SYSTEMS:  All non-GI ROS negative unless otherwise stated in the HPI except for arthritis  Past Medical History:  Diagnosis Date  . Arthritis   . Elevated lipids   . GERD (gastroesophageal reflux disease)   . Hypertension   . Mini stroke (HCC)    POSSIBLE-PT WAS UNSURE IF IT WAS A MINI-STROKE OR AN INFECTION  . Sleep apnea    uses cpap  . Stroke [facility])    mini stroke 2002  . Torn rotator cuff    left    Past Surgical History:  Procedure Laterality Date  . BACK SURGERY  1970   lower  . cyst from back    . JOINT REPLACEMENT Left 07/2016  . KNEE ARTHROSCOPY Left   . KNEE ARTHROSCOPY Right 10/23/2015   Procedure: ARTHROSCOPY KNEE, PARTIAL MEDIAL MENISECTOMY, PARTIAL SYNOVECTOMY;  Surgeon: Kennedy Bucker, MD;  Location: [facility] ORS;  Service: Orthopedics;  Laterality: Right;  . SHOULDER ARTHROSCOPY WITH OPEN ROTATOR CUFF REPAIR Left 08/30/2015   Procedure: SHOULDER ARTHROSCOPY WITH OPEN ROTATOR CUFF REPAIR  and decompression, debridement , biceps tenodesis;  Surgeon: Christena Flake, MD;  Location: [facility] ORS;  Service: Orthopedics;  Laterality: Left;  . TONSILLECTOMY    . TOTAL KNEE ARTHROPLASTY Left 08/12/2016   Procedure: TOTAL KNEE ARTHROPLASTY;  Surgeon: Kennedy Bucker, MD;  Location: [facility] ORS;  Service: Orthopedics;  Laterality: Left;  . TOTAL KNEE ARTHROPLASTY Right 10/07/2016   Procedure: TOTAL KNEE ARTHROPLASTY;  Surgeon: Kennedy Bucker, MD;  Location: [facility] ORS;  Service: Orthopedics;  Laterality: Right;    Social History JABDIEL HALGREN  reports that she quit smoking about 18 years ago. She has never used smokeless tobacco. She reports previous alcohol use. She reports that she does not use drugs.  family history is not on file.  Allergies  Allergen Reactions  . Penicillins Rash    REACTION: rash       PHYSICAL EXAMINATION: No physical examination with telehealth medicine   ASSESSMENT:  1.  Chronic nausea with an element of early satiety and weight loss which is stabilized.  Negative work-up thus far, save cholelithiasis, including EGD, ultrasound,  CT scan, and laboratories.  Other etiologies to consider are gastroparesis, chronic cholecystitis, central, or functional. 2.  Weight loss.  Stable   PLAN:  1.  SCHEDULE SOLID PHASE GASTRIC EMPTYING SCAN "rule out gastroparesis 2.  Continue omeprazole 40 mg daily 3.  Follow-up to be determined after the scan completed.  If the scan is  normal and symptoms persist and are incapacitating, consider surgical opinion for cholecystectomy. The patient initiated and consented for this telehealth medicine visit during the coronavirus pandemic.  He was in his home and I was in the hospital during this encounter.  He understands her may be an associated professional charge for this service which took approximately 25 minutes in total.

## 2019-04-01 NOTE — Telephone Encounter (Signed)
Patient's gastric emptying scan is scheduled for 04/12/2019.  Because of his schedule he was unable to do the test before the end of next week, which was already booked.  Patient called back concerned about waiting for the test to be completed and read before a decision can be made about his condition.  He is eager to go ahead and consider a surgical option earlier.  I said I would pass this concern along and call him back.

## 2019-04-02 NOTE — Telephone Encounter (Signed)
A surgeon will not consider an operation until GI workup completed. Thanks

## 2019-04-04 NOTE — Telephone Encounter (Signed)
Spoke with patient's wife and communicated Dr. Blanch Media response regarding surgery.

## 2019-04-12 ENCOUNTER — Other Ambulatory Visit: Payer: Self-pay

## 2019-04-12 ENCOUNTER — Ambulatory Visit (HOSPITAL_COMMUNITY)
Admission: RE | Admit: 2019-04-12 | Discharge: 2019-04-12 | Disposition: A | Discharge status: Home / Self Care | Payer: Medicare Other | Source: Ambulatory Visit | Attending: Internal Medicine | Admitting: Internal Medicine

## 2019-04-12 DIAGNOSIS — R110 Nausea: Secondary | ICD-10-CM | POA: Diagnosis not present

## 2019-04-12 DIAGNOSIS — R6881 Early satiety: Secondary | ICD-10-CM | POA: Diagnosis present

## 2019-04-12 DIAGNOSIS — R634 Abnormal weight loss: Secondary | ICD-10-CM | POA: Diagnosis present

## 2019-04-12 MED ORDER — TECHNETIUM TC 99M SULFUR COLLOID
2.0000 | Freq: Once | INTRAVENOUS | Status: AC | PRN
  Administered 2019-04-12: 2 via INTRAVENOUS

## 2019-05-05 ENCOUNTER — Telehealth: Payer: Self-pay

## 2019-05-05 NOTE — Telephone Encounter (Signed)
Phone screening complete 

## 2019-05-06 ENCOUNTER — Other Ambulatory Visit: Payer: Self-pay

## 2019-05-06 ENCOUNTER — Encounter: Payer: Self-pay | Admitting: Internal Medicine

## 2019-05-06 ENCOUNTER — Ambulatory Visit (INDEPENDENT_AMBULATORY_CARE_PROVIDER_SITE_OTHER): Payer: Medicare Other | Admitting: Internal Medicine

## 2019-05-06 VITALS — Ht 70.0 in | Wt 189.0 lb

## 2019-05-06 DIAGNOSIS — R6881 Early satiety: Secondary | ICD-10-CM

## 2019-05-06 DIAGNOSIS — R110 Nausea: Secondary | ICD-10-CM | POA: Diagnosis not present

## 2019-05-06 DIAGNOSIS — R634 Abnormal weight loss: Secondary | ICD-10-CM | POA: Diagnosis not present

## 2019-05-06 MED ORDER — ONDANSETRON HCL 4 MG PO TABS: 4.0000 mg | ORAL_TABLET | ORAL | 2 refills | Status: DC | PRN

## 2019-05-06 NOTE — Progress Notes (Signed)
HISTORY OF PRESENT ILLNESS:  Adam Villa is a 68 y.o. male who has been followed in recent months for chronic nausea with an element of early satiety and weight loss.  He schedules this telehealth medicine visit during the coronavirus pandemic for follow-up.  He was last seen virtually April 01, 2019.  His work-up to date has included upper endoscopy which revealed distal esophageal stricture, CT scan which was negative, and abdominal ultrasound which was remarkable for uncomplicated cholelithiasis.  In addition, normal gastric emptying scan.  He had been placed on omeprazole 40 mg daily that he took for at least 3 weeks.  He felt like the medication may have been making his nausea worse for which he discontinued the medication.  His nausea has waxed and waned.  Initially he describes the severity as 9 on a scale of 10.  Currently describes it as 4 on a scale of 10.  Symptoms are currently waxing and waning.  He does tell me though that he has had 3 pound weight loss over the past month.  He has no antiemetics.  He does complain of intermittent solid food dysphasia which was not reported prior to his index upper endoscopy.  Again, no vomiting or abdominal pain.  REVIEW OF SYSTEMS:  All non-GI ROS negative unless otherwise stated in the HPI except for arthritis  Past Medical History:  Diagnosis Date  . Arthritis   . Elevated lipids   . GERD (gastroesophageal reflux disease)   . Hypertension   . Mini stroke (HCC)    POSSIBLE-PT WAS UNSURE IF IT WAS A MINI-STROKE OR AN INFECTION  . Sleep apnea    uses cpap  . Stroke [facility](HCC)    mini stroke 2002  . Torn rotator cuff    left    Past Surgical History:  Procedure Laterality Date  . BACK SURGERY  1970   lower  . cyst from back    . JOINT REPLACEMENT Left 07/2016  . KNEE ARTHROSCOPY Left   . KNEE ARTHROSCOPY Right 10/23/2015   Procedure: ARTHROSCOPY KNEE, PARTIAL MEDIAL MENISECTOMY, PARTIAL SYNOVECTOMY;  Surgeon: Kennedy BuckerMichael Menz, MD;  Location: [facility]  ORS;  Service: Orthopedics;  Laterality: Right;  . SHOULDER ARTHROSCOPY WITH OPEN ROTATOR CUFF REPAIR Left 08/30/2015   Procedure: SHOULDER ARTHROSCOPY WITH OPEN ROTATOR CUFF REPAIR and decompression, debridement , biceps tenodesis;  Surgeon: Christena FlakeJohn J Poggi, MD;  Location: [facility] ORS;  Service: Orthopedics;  Laterality: Left;  . TONSILLECTOMY    . TOTAL KNEE ARTHROPLASTY Left 08/12/2016   Procedure: TOTAL KNEE ARTHROPLASTY;  Surgeon: Kennedy BuckerMichael Menz, MD;  Location: [facility] ORS;  Service: Orthopedics;  Laterality: Left;  . TOTAL KNEE ARTHROPLASTY Right 10/07/2016   Procedure: TOTAL KNEE ARTHROPLASTY;  Surgeon: Kennedy BuckerMichael Menz, MD;  Location: [facility] ORS;  Service: Orthopedics;  Laterality: Right;    Social History Adam CoolsDavid E Weight  reports that she quit smoking about 18 years ago. She has never used smokeless tobacco. She reports previous alcohol use. She reports that she does not use drugs.  family history is not on file.  Allergies  Allergen Reactions  . Penicillins Rash    REACTION: rash       PHYSICAL EXAMINATION: No physical examination with telehealth medicine visit   ASSESSMENT:  1.  Chronic nausea with an element of early satiety weight loss.  Not severe at this time, though continues. 2.  Intermittent solid food dysphasia due to known peptic stricture 3.  Cholelithiasis 4.  Screening colonoscopy is up-to-date (2011 last exam)  PLAN:  1.  PRESCRIBE Zofran (or generic equivalent) 4 mg every 4 hours as needed for nausea; #30; 2 refills 2.  SCHEDULE EGD with dilation in the [place].The nature of the procedure, as well as the risks, benefits, and alternatives were carefully and thoroughly reviewed with the patient. Ample time for discussion and questions allowed. The patient understood, was satisfied, and agreed to proceed. 3.  Should symptoms persist or worsen could consider HIDA scan rule out chronic calculus cholecystitis This telehealth [place] of medicine visit was initiated by and consented for  by the patient.  He was seen sometimes in my office throughout the encounter.  He understands it may be an associated professional charge for this service which totaled 25 minutes.

## 2019-05-06 NOTE — Addendum Note (Signed)
Addended by: Audrea Muscat on: 05/06/2019 02:26 PM   Modules accepted: Orders

## 2019-05-06 NOTE — Patient Instructions (Signed)
1.  PRESCRIBE Zofran (or generic equivalent) 4 mg every 4 hours as needed for nausea; #30; 2 refills  2.  You have been scheduled for an endoscopy. Please follow written instructions given to you at your visit today. If you use inhalers (even only as needed), please bring them with you on the day of your procedure.  3.  Should symptoms persist or worsen we can consider HIDA scan to rule out chronic calculus cholecystitis

## 2019-05-19 ENCOUNTER — Telehealth: Payer: Self-pay | Admitting: Internal Medicine

## 2019-05-19 NOTE — Telephone Encounter (Signed)

## 2019-05-20 ENCOUNTER — Other Ambulatory Visit: Payer: Self-pay

## 2019-05-20 ENCOUNTER — Ambulatory Visit (AMBULATORY_SURGERY_CENTER): Payer: Medicare Other | Admitting: Internal Medicine

## 2019-05-20 ENCOUNTER — Encounter: Payer: Self-pay | Admitting: Internal Medicine

## 2019-05-20 VITALS — BP 128/67 | HR 57 | Temp 98.4°F | Resp 10 | Ht 70.0 in | Wt 189.0 lb

## 2019-05-20 DIAGNOSIS — K253 Acute gastric ulcer without hemorrhage or perforation: Secondary | ICD-10-CM

## 2019-05-20 DIAGNOSIS — K222 Esophageal obstruction: Secondary | ICD-10-CM

## 2019-05-20 DIAGNOSIS — K2970 Gastritis, unspecified, without bleeding: Secondary | ICD-10-CM | POA: Diagnosis present

## 2019-05-20 DIAGNOSIS — K2950 Unspecified chronic gastritis without bleeding: Secondary | ICD-10-CM

## 2019-05-20 DIAGNOSIS — R4702 Dysphasia: Secondary | ICD-10-CM

## 2019-05-20 DIAGNOSIS — R110 Nausea: Secondary | ICD-10-CM

## 2019-05-20 MED ORDER — SODIUM CHLORIDE 0.9 % IV SOLN: 500.0000 mL | Freq: Once | INTRAVENOUS | Status: AC

## 2019-05-20 MED ORDER — PANTOPRAZOLE SODIUM 40 MG PO TBEC: 40.0000 mg | DELAYED_RELEASE_TABLET | Freq: Every day | ORAL | 6 refills | Status: DC

## 2019-05-20 NOTE — Patient Instructions (Signed)
Impression/Recommendations:  Dilation diet handout given to patient. Stricture handout given to patient. Pantoprazole 40 mg. Daily.  Avoid NSAIDs. Tylenol only. Okay to use baby aspirin.    Follow- up with Dr. Henrene Pastor in office in 6 weeks.  YOU HAD AN ENDOSCOPIC PROCEDURE TODAY AT [facility]:   Refer to the procedure report that was given to you for any specific questions about what was found during the examination.  If the procedure report does not answer your questions, please call your gastroenterologist to clarify.  If you requested that your care partner not be given the details of your procedure findings, then the procedure report has been included in a sealed envelope for you to review at your convenience later.  YOU SHOULD EXPECT: Some feelings of bloating in the abdomen. Passage of more gas than usual.  Walking can help get rid of the air that was put into your GI tract during the procedure and reduce the bloating. If you had a lower endoscopy (such as a colonoscopy or flexible sigmoidoscopy) you may notice spotting of blood in your stool or on the toilet paper. If you underwent a bowel prep for your procedure, you may not have a normal bowel movement for a few days.  Please Note:  You might notice some irritation and congestion in your nose or some drainage.  This is from the oxygen used during your procedure.  There is no need for concern and it should clear up in a day or so.  SYMPTOMS TO REPORT IMMEDIATELY:  Following upper endoscopy (EGD)  Vomiting of blood or coffee ground material  New chest pain or pain under the shoulder blades  Painful or persistently difficult swallowing  New shortness of breath  Fever of 100F or higher  Black, tarry-looking stools  For urgent or emergent issues, a gastroenterologist can be reached at any hour by calling [phone].   DIET:  We do recommend a small meal at first, but then you may proceed to your regular diet.   Drink plenty of fluids but you should avoid alcoholic beverages for 24 hours.  ACTIVITY:  You should plan to take it easy for the rest of today and you should NOT DRIVE or use heavy machinery until tomorrow (because of the sedation medicines used during the test).    FOLLOW UP: Our staff will call the number listed on your records 48-72 hours following your procedure to check on you and address any questions or concerns that you may have regarding the information given to you following your procedure. If we do not reach you, we will leave a message.  We will attempt to reach you two times.  During this call, we will ask if you have developed any symptoms of COVID 19. If you develop any symptoms (ie: fever, flu-like symptoms, shortness of breath, cough etc.) before then, please call [phone].  If you test positive for Covid 19 in the 2 weeks post procedure, please call and report this information to [place].    If any biopsies were taken you will be contacted by phone or by letter within the next 1-3 weeks.  Please call us at [phone] if you have not heard about the biopsies in 3 weeks.    SIGNATURES/CONFIDENTIALITY: You and/or your care partner have signed paperwork which will be entered into your electronic medical record.  These signatures attest to the fact that that the information above on your After Visit Summary has been reviewed and is  understood.  Full responsibility of the confidentiality of this discharge information lies with you and/or your care-partner. 

## 2019-05-20 NOTE — Progress Notes (Signed)
Report to PACU, RN, vss, BBS= Clear.  

## 2019-05-20 NOTE — Op Note (Signed)
[facility] Patient Name: Adam BatheDavid Villa Procedure Date: 05/20/2019 9:12 AM MRN: [id number] Endoscopist: Wilhemina BonitoJohn N. Marina GoodellPerry , MD Age: [age] Referring MD:  Date of Birth: 11/19/1950 Gender: Male Account #: [id number] Procedure:                [facility] with biopsy[facility]                            [facility]ilation of the esophagus?"62F Indications:              Dysphagia, Epigastric abdominal pain (recent days) Medicines:                Monitored Anesthesia Care Procedure:                Pre-Anesthesia Assessment:                           - Prior to the procedure, a History and Physical                            was performed, and patient medications and                            allergies were reviewed. The patient's tolerance of                            previous anesthesia was also reviewed. The risks                            and benefits of the procedure and the sedation                            options and risks were discussed with the patient.                            All questions were answered, and informed consent                            was obtained. Prior Anticoagulants: The patient has                            taken no previous anticoagulant or antiplatelet                            agents. ASA Grade Assessment: II - A patient with                            mild systemic disease. After reviewing the risks                            and benefits, the patient was deemed in                            satisfactory condition to undergo the procedure.  After obtaining informed consent, the endoscope was                            passed under direct vision. Throughout the                            procedure, the patient's blood pressure, pulse, and                            oxygen saturations were monitored continuously. The                            Endoscope was introduced through the mouth, and   advanced to the second part of duodenum. The upper                            GI endoscopy was accomplished without difficulty.                            The patient tolerated the procedure well. Scope In: Scope Out: Findings:                 One benign-appearing, intrinsic moderate stenosis                            was found 35 cm from the incisors. This stenosis                            measured 1.5 cm (inner diameter). The scope was                            withdrawn after completing the endoscopic survey.                            Dilation was performed with a Maloney dilator with                            no resistance at 45 Fr.. No heme                           The exam of the esophagus was otherwise normal.                           Many linear gastric ulcers were found in the                            gastric body. See images. Biopsies were taken with                            a cold forceps for histology.                           The examined duodenum was normal.  The cardia and gastric fundus were normal on                            retroflexion save a small hiatal hernia. Complications:            No immediate complications. Estimated Blood Loss:     Estimated blood loss: none. Impression:               1. Esophageal stricture status post dilation                           2. Linear gastric ulcers proximal stomach status                            post biopsies                           3. Otherwise normal exam- Recommendation:           1. PRESCRIBE pantoprazole 40 mg daily; #30; 6                            refills                           2. Avoid NSAIDs if taking. Okay to use baby aspirin                            if prescribed by your supervising physician for                            medical purposes                           3. Follow-up biopsies                           4. Office follow-up with Dr. Marina GoodellPerry in 6 weeks. Wilhemina BonitoJohn N.  Marina GoodellPerry, MD 05/20/2019 9:41:09 AM This report has been signed electronically.

## 2019-05-20 NOTE — Progress Notes (Signed)
Called to room to assist during endoscopic procedure.  Patient ID and intended procedure confirmed with present staff. Received instructions for my participation in the procedure from the performing physician.  

## 2019-05-24 ENCOUNTER — Encounter: Payer: Self-pay | Admitting: Internal Medicine

## 2019-05-24 ENCOUNTER — Telehealth: Payer: Self-pay

## 2019-05-24 NOTE — Telephone Encounter (Signed)
  Follow up Call-  Call back number 05/20/2019 03/09/2019  Post procedure Call Back phone  # [phone]  Permission to leave phone message Yes Yes  Some recent data might be hidden     Patient questions:  Do you have a fever, pain , or abdominal swelling? No. Pain Score  0 *  Have you tolerated food without any problems? Yes.    Have you been able to return to your normal activities? Yes.    Do you have any questions about your discharge instructions: Diet   No. Medications  No. Follow up visit  No.  Do you have questions or concerns about your Care? No.  Actions: * If pain score is 4 or above: No action needed, pain <4.  1. Have you developed a fever since your procedure? no  2.   Have you had an respiratory symptoms (SOB or cough) since your procedure? no  3.   Have you tested positive for COVID 19 since your procedure no  4.   Have you had any family members/close contacts diagnosed with the COVID 19 since your procedure?  no   If yes to any of these questions please route to Joylene John, RN and Alphonsa Gin, Therapist, sports.

## 2019-06-16 ENCOUNTER — Encounter: Payer: Self-pay | Admitting: Internal Medicine

## 2019-12-01 ENCOUNTER — Other Ambulatory Visit: Payer: Self-pay | Admitting: Internal Medicine

## 2020-06-12 ENCOUNTER — Telehealth: Payer: Self-pay | Admitting: Internal Medicine

## 2020-06-12 NOTE — Telephone Encounter (Signed)
Pt aware and appt scheduled for Thursday 06/13/20@11am .

## 2020-06-12 NOTE — Telephone Encounter (Signed)
Pt is requesting a call back from a nurse to discuss possibly having his gall bladder removed.

## 2020-06-12 NOTE — Telephone Encounter (Signed)
Have him come in to office to see me this Thursday the 19 th at 11 am (open spot currently)

## 2020-06-12 NOTE — Telephone Encounter (Signed)
Pt states he saw Dr. Marina Goodell a while back and was placed on protonix. Reports the protonix helped but it has stopped working as well this past month. He is having abdominal discomfort and stomach upset again. Pt is wanting to know if Dr. Marina Goodell would refer him to have his gallbladder removed. Please advise.

## 2020-06-14 ENCOUNTER — Ambulatory Visit (INDEPENDENT_AMBULATORY_CARE_PROVIDER_SITE_OTHER): Payer: Medicare Other | Admitting: Internal Medicine

## 2020-06-14 ENCOUNTER — Encounter: Payer: Self-pay | Admitting: Internal Medicine

## 2020-06-14 VITALS — BP 136/70 | HR 76 | Ht 70.0 in | Wt 184.0 lb

## 2020-06-14 DIAGNOSIS — K222 Esophageal obstruction: Secondary | ICD-10-CM

## 2020-06-14 DIAGNOSIS — K8020 Calculus of gallbladder without cholecystitis without obstruction: Secondary | ICD-10-CM

## 2020-06-14 DIAGNOSIS — K253 Acute gastric ulcer without hemorrhage or perforation: Secondary | ICD-10-CM

## 2020-06-14 DIAGNOSIS — R110 Nausea: Secondary | ICD-10-CM

## 2020-06-14 DIAGNOSIS — K819 Cholecystitis, unspecified: Secondary | ICD-10-CM | POA: Diagnosis not present

## 2020-06-14 DIAGNOSIS — K219 Gastro-esophageal reflux disease without esophagitis: Secondary | ICD-10-CM | POA: Diagnosis not present

## 2020-06-14 MED ORDER — ONDANSETRON HCL 4 MG PO TABS: 4.0000 mg | ORAL_TABLET | ORAL | 3 refills | Status: DC | PRN

## 2020-06-14 MED ORDER — PANTOPRAZOLE SODIUM 40 MG PO TBEC: 40.0000 mg | DELAYED_RELEASE_TABLET | Freq: Every day | ORAL | 3 refills | Status: DC

## 2020-06-14 NOTE — Patient Instructions (Addendum)
We have sent the following medications to your pharmacy for you to pick up at your convenience:  Pantoprazole and Zofran  You have been scheduled for a HIDA scan at [facility] [place] on 06/27/2020. Please arrive 30 minutes prior to your scheduled appointment at  1:32GM. Make certain not to have anything to eat or drink after midnight prior to your test. Should this appointment date or time not work well for you, please call radiology scheduling at [phone].  _____________________________________________________________________ hepatobiliary (HIDA) scan is an imaging procedure used to diagnose problems in the liver, gallbladder and bile ducts. In the HIDA scan, a radioactive chemical or tracer is injected into a vein in your arm. The tracer is handled by the liver like bile. Bile is a fluid produced and excreted by your liver that helps your digestive system break down fats in the foods you eat. Bile is stored in your gallbladder and the gallbladder releases the bile when you eat a meal. A special nuclear medicine scanner (gamma camera) tracks the flow of the tracer from your liver into your gallbladder and small intestine.  During your HIDA scan  You'll be asked to change into a hospital gown before your HIDA scan begins. Your health care team will position you on a table, usually on your back. The radioactive tracer is then injected into a vein in your arm.The tracer travels through your bloodstream to your liver, where it's taken up by the bile-producing cells. The radioactive tracer travels with the bile from your liver into your gallbladder and through your bile ducts to your small intestine.You may feel some pressure while the radioactive tracer is injected into your vein. As you lie on the table, a special gamma camera is positioned over your abdomen taking pictures of the tracer as it moves through your body. The gamma camera takes pictures continually for about an hour. You'll need to keep  still during the HIDA scan. This can become uncomfortable, but you may find that you can lessen the discomfort by taking deep breaths and thinking about other things. Tell your health care team if you're uncomfortable. The radiologist will watch on a computer the progress of the radioactive tracer through your body. The HIDA scan may be stopped when the radioactive tracer is seen in the gallbladder and enters your small intestine. This typically takes about an hour. In some cases extra imaging will be performed if original images aren't satisfactory, if morphine is given to help visualize the gallbladder or if the medication CCK is given to look at the contraction of the gallbladder. This test typically takes 2 hours to complete. ___________________________________________________________   Please follow up with Dr. Marina Goodell on 08/07/2020 at 2:00pm

## 2020-06-14 NOTE — Progress Notes (Signed)
HISTORY OF PRESENT ILLNESS:  Adam Villa is a 69 y.o. with past medical history as listed below who presents today with a chief complaint of nausea.  Patient was last seen via telehealth medicine May 06, 2019 regarding chronic nausea with an element of early satiety and weight loss.  Also new complaints of intermittent solid food dysphagia.  See that dictation for details.  Work-up for his nausea included abdominal ultrasound which revealed gallstones.  CT scan of the abdomen pelvis was unremarkable.  Gastric emptying scan was normal.  Upper endoscopy was performed May 20, 2019 revealed a distal esophageal stricture which was dilated with 54 [place] Maloney dilator.  He was also found to have multiple linear ulcers in the stomach.  Biopsies revealed mild chronic gastritis without evidence of Helicobacter pylori or intestinal dysplasia.  He was placed on pantoprazole 40 mg daily and asked to follow-up in 6 weeks.  Patient tells me that his symptoms resolved on pantoprazole therapy.  No further dysphagia.  Improved nausea.  However, approximately 1 month ago, he developed recurrent problems with nausea.  He describes it as a sick to the stomach feeling.  No vomiting.  Tends to be worse after consuming liquids.  Weight has been stable.  He has been compliant with PPI therapy on a daily basis.  No new medications.  Contacted the office 2 days ago with concerns and questions regarding the need for possible cholecystectomy.  This appointment arranged.  Prior colonoscopy in 2003 and again in 2011 were both negative for neoplasia.  He has not received his Covid vaccination series.  REVIEW OF SYSTEMS:  All non-GI ROS negative unless otherwise stated in the HPI except for arthritis, back pain, hearing problems, sleeping problems, muscle cramps, visual change  Past Medical History:  Diagnosis Date  . Arthritis   . Elevated lipids   . GERD (gastroesophageal reflux disease)   . Hyperlipidemia   . Hypertension    . Mini stroke ([place])    POSSIBLE-PT WAS UNSURE IF IT WAS A MINI-STROKE OR AN INFECTION  . Sleep apnea    uses cpap  . Stroke [facility])    mini stroke 2002  . Torn rotator cuff    left    Past Surgical History:  Procedure Laterality Date  . [place]   lower  . COLONOSCOPY    . cyst from back    . JOINT REPLACEMENT Left 07/2016  . KNEE ARTHROSCOPY Left   . KNEE ARTHROSCOPY Right 10/23/2015   Procedure: ARTHROSCOPY KNEE, PARTIAL MEDIAL MENISECTOMY, PARTIAL SYNOVECTOMY;  Surgeon: Hessie Knows, MD;  Location: [facility] OR[facility];  Service: Orthopedics;  Laterality: Right;  . MOHS SURGERY    . SHOULDER ARTHROSCOPY WITH OPEN ROTATOR CUFF REPAIR Left 08/30/2015   Procedure: SHOULDER ARTHROSCOPY WITH OPEN ROTATOR CUFF REPAIR and decompression, debridement , biceps tenodesis;  Surgeon: Corky Mull, MD;  Location: [facility];  Service: Orthopedics;  Laterality: Left;  . TONSILLECTOMY    . TOTAL KNEE ARTHROPLASTY Left 08/12/2016   Procedure: TOTAL KNEE ARTHROPLASTY;  Surgeon: Hessie Knows, MD;  Location: [facility] OR[facility];  Service: Orthopedics;  Laterality: Left;  . TOTAL KNEE ARTHROPLASTY Right 10/07/2016   Procedure: TOTAL KNEE ARTHROPLASTY;  Surgeon: Hessie Knows, MD;  Location: [facility];  Service: Orthopedics;  Laterality: Right;    Social History ALOYS HUPFER  reports that she quit smoking about 19 years ago. She has never used smokeless tobacco. She reports previous alcohol use. She reports that she does not use  drugs.  family history is not on file.  Allergies  Allergen Reactions  . Penicillins Rash    REACTION: rash       PHYSICAL EXAMINATION: Vital signs: BP 136/70 (BP Location: Left Arm, Patient Position: Sitting)   Pulse 76   Ht 5' 10"  (1.778 m)   Wt 184 lb (83.5 kg)   SpO2 98%   BMI 26.40 kg/m   Constitutional: generally well-appearing, no acute distress Psychiatric: alert and oriented x3, cooperative Eyes: extraocular movements intact, anicteric, conjunctiva pink Mouth: oral  pharynx moist, no lesions Neck: supple no lymphadenopathy Cardiovascular: heart regular rate and rhythm, no murmur Lungs: clear to auscultation bilaterally Abdomen: soft, nontender, nondistended, no obvious ascites, no peritoneal signs, normal bowel sounds, no organomegaly Rectal: Omitted Extremities: no clubbing, cyanosis, or lower extremity edema bilaterally Skin: no lesions on visible extremities Neuro: No focal deficits.  Cranial nerves intact  ASSESSMENT:  1.  Recurrent nausea.  Question breakthrough reflux symptoms.  Question recurrent ulcerative change at this time.  Question chronic gallbladder disease. 2.  GERD with peptic stricture.  Dysphagia improved post dilation. 3.  Cholelithiasis 4.  Negative for neoplasia colonoscopy January 2011.  Due for follow-up   PLAN:  1.  Reflux precautions 2.  Continue pantoprazole 40 mg daily.  Prescription refilled.  Medication risks reviewed 3.  Prescribe Zofran 4 mg every 4 hours as needed for nausea.  Refills.  Medication risks and side effects reviewed.  4.  Schedule HIDA scan "rule out chronic cholecystitis " 5.  Office follow-up in 6 weeks 6.  If symptoms ongoing, may need relook endoscopy as well as colonoscopy for the purposes of screening. A total time of 30 minutes was spent preparing to see the patient, reviewing a myriad of tests and pathology, obtaining comprehensive history and performing comprehensive physical examination.  Counseling the patient regarding his above listed issues.  Ordering medications as well as advanced radiology studies and follow-up.  Finally, documenting clinical information in the health record.

## 2020-06-25 ENCOUNTER — Encounter
Admission: RE | Admit: 2020-06-25 | Discharge: 2020-06-25 | Disposition: A | Discharge status: Home / Self Care | Payer: Medicare Other | Source: Ambulatory Visit | Attending: Internal Medicine | Admitting: Internal Medicine

## 2020-06-25 ENCOUNTER — Other Ambulatory Visit: Payer: Self-pay

## 2020-06-25 DIAGNOSIS — K819 Cholecystitis, unspecified: Secondary | ICD-10-CM | POA: Diagnosis present

## 2020-06-25 MED ORDER — TECHNETIUM TC 99M MEBROFENIN IV KIT
5.0000 | PACK | Freq: Once | INTRAVENOUS | Status: AC | PRN
  Administered 2020-06-25: 4.6 via INTRAVENOUS

## 2020-06-27 ENCOUNTER — Ambulatory Visit: Payer: Medicare Other

## 2020-08-07 ENCOUNTER — Ambulatory Visit: Payer: Medicare Other | Admitting: Internal Medicine

## 2021-06-09 ENCOUNTER — Other Ambulatory Visit: Payer: Self-pay | Admitting: Internal Medicine

## 2021-09-02 ENCOUNTER — Other Ambulatory Visit: Payer: Self-pay | Admitting: Internal Medicine

## 2023-05-06 ENCOUNTER — Encounter: Payer: Self-pay | Admitting: Ophthalmology

## 2023-05-06 NOTE — Anesthesia Preprocedure Evaluation (Addendum)
Anesthesia Evaluation  Patient identified by MRN, date of birth, ID band Patient awake    Reviewed: Allergy & Precautions, H&P , NPO status , Patient's Chart, lab work & pertinent test results  Airway Mallampati: IV  TM Distance: <3 FB Neck ROM: Full    Dental  (+) Caps   Pulmonary sleep apnea , former smoker   Pulmonary exam normal breath sounds clear to auscultation       Cardiovascular hypertension, Normal cardiovascular exam Rhythm:Regular Rate:Normal     Neuro/Psych CVA negative neurological ROS  negative psych ROS   GI/Hepatic negative GI ROS, Neg liver ROS,GERD  ,,  Endo/Other  negative endocrine ROS    Renal/GU negative Renal ROS  negative genitourinary   Musculoskeletal negative musculoskeletal ROS (+) Arthritis ,    Abdominal   Peds negative pediatric ROS (+)  Hematology negative hematology ROS (+)   Anesthesia Other Findings Arthritis Elevated lipids Torn rotator cuff Hypertension GERD (gastroesophageal reflux disease) Mini stroke Sleep apnea Stroke  Hyperlipidemia Macular degeneration Polymyalgia rheumatica   Reproductive/Obstetrics negative OB ROS                              Anesthesia Physical Anesthesia Plan  ASA: 3  Anesthesia Plan: MAC   Post-op Pain Management:    Induction: Intravenous  PONV Risk Score and Plan:   Airway Management Planned: Natural Airway and Nasal Cannula  Additional Equipment:   Intra-op Plan:   Post-operative Plan:   Informed Consent: I have reviewed the patients History and Physical, chart, labs and discussed the procedure including the risks, benefits and alternatives for the proposed anesthesia with the patient or authorized representative who has indicated his/her understanding and acceptance.     Dental Advisory Given  Plan Discussed with: Anesthesiologist, CRNA and Surgeon  Anesthesia Plan Comments: (Patient  consented for risks of anesthesia including but not limited to:  - adverse reactions to medications - damage to eyes, teeth, lips or other oral mucosa - nerve damage due to positioning  - sore throat or hoarseness - Damage to heart, brain, nerves, lungs, other parts of body or loss of life  Patient voiced understanding.)        Anesthesia Quick Evaluation

## 2023-05-07 NOTE — Discharge Instructions (Signed)

## 2023-05-12 ENCOUNTER — Encounter: Payer: Self-pay | Admitting: Ophthalmology

## 2023-05-12 ENCOUNTER — Ambulatory Visit: Payer: Medicare Other | Admitting: Anesthesiology

## 2023-05-12 ENCOUNTER — Other Ambulatory Visit: Payer: Self-pay

## 2023-05-12 ENCOUNTER — Ambulatory Visit
Admission: RE | Admit: 2023-05-12 | Discharge: 2023-05-12 | Disposition: A | Discharge status: Home / Self Care | Payer: Medicare Other | Attending: Ophthalmology | Admitting: Ophthalmology

## 2023-05-12 ENCOUNTER — Encounter
Admission: RE | Disposition: A | Discharge status: Home / Self Care | Payer: Self-pay | Source: Home / Self Care | Attending: Ophthalmology

## 2023-05-12 DIAGNOSIS — H2511 Age-related nuclear cataract, right eye: Secondary | ICD-10-CM | POA: Diagnosis not present

## 2023-05-12 DIAGNOSIS — Z87891 Personal history of nicotine dependence: Secondary | ICD-10-CM | POA: Insufficient documentation

## 2023-05-12 HISTORY — DX: Unspecified macular degeneration: H35.30

## 2023-05-12 HISTORY — PX: CATARACT EXTRACTION W/PHACO: SHX586

## 2023-05-12 SURGERY — PHACOEMULSIFICATION, CATARACT, WITH IOL INSERTION
Anesthesia: Monitor Anesthesia Care | Laterality: Right

## 2023-05-12 MED ORDER — MOXIFLOXACIN HCL 0.5 % OP SOLN
OPHTHALMIC | Status: DC | PRN
  Administered 2023-05-12: .2 mL via OPHTHALMIC

## 2023-05-12 MED ORDER — SIGHTPATH DOSE#1 BSS IO SOLN
INTRAOCULAR | Status: DC | PRN
  Administered 2023-05-12: 15 mL

## 2023-05-12 MED ORDER — SIGHTPATH DOSE#1 NA CHONDROIT SULF-NA HYALURON 40-17 MG/ML IO SOLN
INTRAOCULAR | Status: DC | PRN
  Administered 2023-05-12: 1 mL via INTRAOCULAR

## 2023-05-12 MED ORDER — SIGHTPATH DOSE#1 BSS IO SOLN
INTRAOCULAR | Status: DC | PRN
  Administered 2023-05-12: 62 mL via OPHTHALMIC

## 2023-05-12 MED ORDER — ARMC OPHTHALMIC DILATING DROPS
1.0000 | OPHTHALMIC | Status: DC | PRN
  Administered 2023-05-12 (×3): 1 via OPHTHALMIC

## 2023-05-12 MED ORDER — BRIMONIDINE TARTRATE-TIMOLOL 0.2-0.5 % OP SOLN
OPHTHALMIC | Status: DC | PRN
  Administered 2023-05-12: 1 [drp] via OPHTHALMIC

## 2023-05-12 MED ORDER — TETRACAINE HCL 0.5 % OP SOLN
1.0000 [drp] | OPHTHALMIC | Status: DC | PRN
  Administered 2023-05-12 (×3): 1 [drp] via OPHTHALMIC

## 2023-05-12 MED ORDER — LACTATED RINGERS IV SOLN: INTRAVENOUS | Status: DC

## 2023-05-12 MED ORDER — FENTANYL CITRATE (PF) 100 MCG/2ML IJ SOLN
INTRAMUSCULAR | Status: DC | PRN
  Administered 2023-05-12: 50 ug via INTRAVENOUS

## 2023-05-12 MED ORDER — SIGHTPATH DOSE#1 BSS IO SOLN
INTRAOCULAR | Status: DC | PRN
  Administered 2023-05-12: 1 mL

## 2023-05-12 MED ORDER — MIDAZOLAM HCL 2 MG/2ML IJ SOLN
INTRAMUSCULAR | Status: DC | PRN
  Administered 2023-05-12: 2 mg via INTRAVENOUS

## 2023-05-12 MED ORDER — SODIUM CHLORIDE 0.9% FLUSH
INTRAVENOUS | Status: DC | PRN
  Administered 2023-05-12: 2 mL via INTRAVENOUS

## 2023-05-12 SURGICAL SUPPLY — 11 items
ANGLE REVERSE CUT SHRT 25GA (CUTTER) ×1
CATARACT SUITE SIGHTPATH (MISCELLANEOUS) ×1 IMPLANT
CYSTOTOME ANGL RVRS SHRT 25G (CUTTER) ×1 IMPLANT
CYSTOTOME ANGL RVRS SHRT 25GA (CUTTER) ×1 IMPLANT
FEE CATARACT SUITE SIGHTPATH (MISCELLANEOUS) ×1 IMPLANT
GLOVE BIOGEL PI IND STRL 8 (GLOVE) ×1 IMPLANT
GLOVE SURG ENC TEXT LTX SZ8 (GLOVE) ×1 IMPLANT
LENS IOL TECNIS EYHANCE 22.0 (Intraocular Lens) IMPLANT
NDL FILTER BLUNT 18X1 1/2 (NEEDLE) ×1 IMPLANT
NEEDLE FILTER BLUNT 18X1 1/2 (NEEDLE) ×1 IMPLANT
SYR 3ML LL SCALE MARK (SYRINGE) ×1 IMPLANT

## 2023-05-12 NOTE — Op Note (Signed)
PREOPERATIVE DIAGNOSIS:  Nuclear sclerotic cataract of the right eye.   POSTOPERATIVE DIAGNOSIS:  Nuclear Cataract  Posterior Cataract   OPERATIVE PROCEDURE:ORPROCALL@   SURGEON:  Galen Manila, MD.   ANESTHESIA:  Anesthesiologist: Marisue Humble, MD  1.      Managed anesthesia care. 2.      0.32ml of Shugarcaine was instilled in the eye following the paracentesis.   COMPLICATIONS:  None.   TECHNIQUE:   Stop and chop   DESCRIPTION OF PROCEDURE:  The patient was examined and consented in the preoperative holding area where the aforementioned topical anesthesia was applied to the right eye and then brought back to the Operating Room where the right eye was prepped and draped in the usual sterile ophthalmic fashion and a lid speculum was placed. A paracentesis was created with the side port blade and the anterior chamber was filled with viscoelastic. A near clear corneal incision was performed with the steel keratome. A continuous curvilinear capsulorrhexis was performed with a cystotome followed by the capsulorrhexis forceps. Hydrodissection and hydrodelineation were carried out with BSS on a blunt cannula. The lens was removed in a stop and chop  technique and the remaining cortical material was removed with the irrigation-aspiration handpiece. The capsular bag was inflated with viscoelastic and the Technis [id number]  lens was placed in the capsular bag without complication. The remaining viscoelastic was removed from the eye with the irrigation-aspiration handpiece. The wounds were hydrated. The anterior chamber was flushed with BSS and the eye was inflated to physiologic pressure. 0.29ml of Vigamox was placed in the anterior chamber. The wounds were found to be water tight. The eye was dressed with Combigan. The patient was given protective glasses to wear throughout the day and a shield with which to sleep tonight. The patient was also given drops with which to begin a drop regimen today and will  follow-up with me in one day. Implant Name Type Inv. Item Serial No. Manufacturer Lot No. LRB No. Used Action  LENS IOL TECNIS EYHANCE 22.0 - [id number] Intraocular Lens LENS IOL TECNIS EYHANCE 22.0 [id number] [employer]  Right 1 Implanted   Procedure(s): CATARACT EXTRACTION PHACO AND INTRAOCULAR LENS PLACEMENT (IOC) RIGHT 13.49 01:03.4 (Right)  Electronically signed: Galen Manila 05/12/2023 10:18 AM

## 2023-05-12 NOTE — H&P (Signed)
[facility]   Primary Care Physician:  Mick Sell, MD Ophthalmologist: Dr. Druscilla Brownie  Pre-Procedure History & Physical: HPI:  Adam Villa is a 72 y.o. adult here for cataract surgery.   Past Medical History:  Diagnosis Date   Arthritis    Elevated lipids    GERD (gastroesophageal reflux disease)    Hyperlipidemia    Hypertension    Macular degeneration    Mini stroke    POSSIBLE-PT WAS UNSURE IF IT WAS A MINI-STROKE OR AN INFECTION   Sleep apnea    uses cpap   Stroke [facility])    mini stroke 2002   Torn rotator cuff    left    Past Surgical History:  Procedure Laterality Date   BACK SURGERY  1970   lower   COLONOSCOPY     cyst from back     JOINT REPLACEMENT Left 07/2016   KNEE ARTHROSCOPY Left    KNEE ARTHROSCOPY Right 10/23/2015   Procedure: ARTHROSCOPY KNEE, PARTIAL MEDIAL MENISECTOMY, PARTIAL SYNOVECTOMY;  Surgeon: Kennedy Bucker, MD;  Location: [facility] ORS;  Service: Orthopedics;  Laterality: Right;   MOHS SURGERY     SHOULDER ARTHROSCOPY WITH OPEN ROTATOR CUFF REPAIR Left 08/30/2015   Procedure: SHOULDER ARTHROSCOPY WITH OPEN ROTATOR CUFF REPAIR and decompression, debridement , biceps tenodesis;  Surgeon: Christena Flake, MD;  Location: [facility] OR[facility];  Service: Orthopedics;  Laterality: Left;   TONSILLECTOMY     TOTAL KNEE ARTHROPLASTY Left 08/12/2016   Procedure: TOTAL KNEE ARTHROPLASTY;  Surgeon: Kennedy Bucker, MD;  Location: [facility];  Service: Orthopedics;  Laterality: Left;   TOTAL KNEE ARTHROPLASTY Right 10/07/2016   Procedure: TOTAL KNEE ARTHROPLASTY;  Surgeon: Kennedy Bucker, MD;  Location: [facility] OR[facility];  Service: Orthopedics;  Laterality: Right;    Prior to Admission medications   Medication Sig Start Date End Date Taking? Authorizing Provider  amLODipine (NORVASC) 10 MG tablet Take 10 mg by mouth every other day.   Yes [provider]  ASPIRIN 81 PO Take 81 mg by mouth daily.   Yes [provider]  diphenhydramine-acetaminophen (TYLENOL PM)  25-500 MG TABS tablet Take 2 tablets by mouth at bedtime as needed.   Yes [provider]  Misc Natural Products (TURMERIC CURCUMIN) CAPS Take by mouth.   Yes [provider]  Multiple Vitamins-Minerals (PRESERVISION AREDS 2 PO) Take by mouth.   Yes [provider]  Omega-3 Fatty Acids (FISH OIL) 1200 MG CAPS Take 2 capsules by mouth daily.   Yes [provider]    Allergies as of 04/24/2023 - Review Complete 06/14/2020  Allergen Reaction Noted   Penicillins Rash 11/12/2009    Family History  Problem Relation Age of Onset   Colon cancer Neg Hx    Esophageal cancer Neg Hx    Rectal cancer Neg Hx    Stomach cancer Neg Hx    Colon polyps Neg Hx     Social History   Socioeconomic History   Marital status: Married    Spouse name: Not on file   Number of children: Not on file   Years of education: Not on file   Highest education level: Not on file  Occupational History   Not on file  Tobacco Use   Smoking status: Former    Current packs/day: 0.00    Types: Cigarettes    Quit date: 08/21/2000    Years since quitting: 22.7   Smokeless tobacco: Never  Vaping Use   Vaping status: Never Used  Substance and Sexual Activity   Alcohol use: Not Currently   Drug use: No   Sexual activity: Yes  Other Topics Concern   Not on file  Social History Narrative   Not on file   Social Determinants of Health   Financial Resource Strain: Not on file  Food Insecurity: Not on file  Transportation Needs: Not on file  Physical Activity: Not on file  Stress: Not on file  Social Connections: Not on file  Intimate Partner Violence: Not on file    Review of Systems: See HPI, otherwise negative ROS  Physical Exam: BP (!) 155/72   Pulse (!) 54   Temp 98.4 F (36.9 C) (Temporal)   Resp 14   Ht 5\' 10"  (1.778 m)   Wt 85.8 kg   SpO2 98%   BMI 27.13 kg/m  General:   Alert, cooperative in NAD Head:  Normocephalic and atraumatic. Respiratory:  Normal  work of breathing. Cardiovascular:  RRR  Impression/Plan: Adam Villa is here for cataract surgery.  Risks, benefits, limitations, and alternatives regarding cataract surgery have been reviewed with the patient.  Questions have been answered.  All parties agreeable.   Galen Manila, MD  05/12/2023, 9:55 AM

## 2023-05-12 NOTE — Transfer of Care (Signed)
Immediate Anesthesia Transfer of Care Note  Patient: Adam Villa  Procedure(s) Performed: CATARACT EXTRACTION PHACO AND INTRAOCULAR LENS PLACEMENT (IOC) RIGHT 13.49 01:03.4 (Right)  Patient Location: PACU  Anesthesia Type: MAC  Level of Consciousness: awake, alert  and patient cooperative  Airway and Oxygen Therapy: Patient Spontanous Breathing and Patient connected to supplemental oxygen  Post-op Assessment: Post-op Vital signs reviewed, Patient's Cardiovascular Status Stable, Respiratory Function Stable, Patent Airway and No signs of Nausea or vomiting  Post-op Vital Signs: Reviewed and stable  Complications: There were no known notable events for this encounter.

## 2023-05-12 NOTE — Anesthesia Postprocedure Evaluation (Signed)
Anesthesia Post Note  Patient: Adam Villa  Procedure(s) Performed: CATARACT EXTRACTION PHACO AND INTRAOCULAR LENS PLACEMENT (IOC) RIGHT 13.49 01:03.4 (Right)  Patient location during evaluation: PACU Anesthesia Type: MAC Level of consciousness: awake and alert Pain management: pain level controlled Vital Signs Assessment: post-procedure vital signs reviewed and stable Respiratory status: spontaneous breathing, nonlabored ventilation, respiratory function stable and patient connected to nasal cannula oxygen Cardiovascular status: blood pressure returned to baseline and stable Postop Assessment: no apparent nausea or vomiting Anesthetic complications: no   There were no known notable events for this encounter.   Last Vitals:  Vitals:   05/12/23 1019 05/12/23 1025  BP:  127/69  Pulse: (!) 50 (!) 52  Resp:    Temp: 36.5 C   SpO2: 95% 97%    Last Pain:  Vitals:   05/12/23 1025  TempSrc:   PainSc: 0-No pain                 Kyona Chauncey C Inocencio Roy

## 2023-05-13 ENCOUNTER — Encounter: Payer: Self-pay | Admitting: Ophthalmology

## 2023-06-09 ENCOUNTER — Encounter: Payer: Self-pay | Admitting: Ophthalmology

## 2023-06-09 NOTE — Anesthesia Preprocedure Evaluation (Addendum)
Anesthesia Evaluation  Patient identified by MRN, date of birth, ID band Patient awake    Reviewed: Allergy & Precautions, H&P , NPO status , Patient's Chart, lab work & pertinent test results  Airway Mallampati: IV  TM Distance: <3 FB Neck ROM: Full    Dental no notable dental hx. (+) Caps   Pulmonary neg pulmonary ROS, sleep apnea , former smoker   Pulmonary exam normal breath sounds clear to auscultation       Cardiovascular hypertension, negative cardio ROS Normal cardiovascular exam Rhythm:Regular Rate:Normal     Neuro/Psych CVA negative neurological ROS  negative psych ROS   GI/Hepatic negative GI ROS, Neg liver ROS,GERD  ,,  Endo/Other  negative endocrine ROS    Renal/GU negative Renal ROS  negative genitourinary   Musculoskeletal negative musculoskeletal ROS (+) Arthritis ,    Abdominal   Peds negative pediatric ROS (+)  Hematology negative hematology ROS (+)   Anesthesia Other Findings Arthritis  Elevated lipids Torn rotator cuff  Hypertension GERD (gastroesophageal reflux disease) Mini stroke Sleep apnea  Stroke ([facility]) Hyperlipidemia  Macular degeneration Previous cataract 05-12-23    Reproductive/Obstetrics negative OB ROS                              Anesthesia Physical Anesthesia Plan  ASA: 3  Anesthesia Plan: MAC   Post-op Pain Management:    Induction: Intravenous  PONV Risk Score and Plan:   Airway Management Planned: Natural Airway and Nasal Cannula  Additional Equipment:   Intra-op Plan:   Post-operative Plan:   Informed Consent: I have reviewed the patients History and Physical, chart, labs and discussed the procedure including the risks, benefits and alternatives for the proposed anesthesia with the patient or authorized representative who has indicated his/her understanding and acceptance.     Dental Advisory Given  Plan Discussed with:  Anesthesiologist, CRNA and Surgeon  Anesthesia Plan Comments: (Patient consented for risks of anesthesia including but not limited to:  - adverse reactions to medications - damage to eyes, teeth, lips or other oral mucosa - nerve damage due to positioning  - sore throat or hoarseness - Damage to heart, brain, nerves, lungs, other parts of body or loss of life  Patient voiced understanding.)         Anesthesia Quick Evaluation

## 2023-06-12 NOTE — Discharge Instructions (Signed)

## 2023-06-16 ENCOUNTER — Ambulatory Visit: Payer: Medicare Other | Admitting: Anesthesiology

## 2023-06-16 ENCOUNTER — Encounter
Admission: RE | Disposition: A | Discharge status: Home / Self Care | Payer: Self-pay | Source: Home / Self Care | Attending: Ophthalmology

## 2023-06-16 ENCOUNTER — Other Ambulatory Visit: Payer: Self-pay

## 2023-06-16 ENCOUNTER — Encounter: Payer: Self-pay | Admitting: Ophthalmology

## 2023-06-16 ENCOUNTER — Ambulatory Visit
Admission: RE | Admit: 2023-06-16 | Discharge: 2023-06-16 | Disposition: A | Discharge status: Home / Self Care | Payer: Medicare Other | Attending: Ophthalmology | Admitting: Ophthalmology

## 2023-06-16 DIAGNOSIS — Z87891 Personal history of nicotine dependence: Secondary | ICD-10-CM | POA: Diagnosis not present

## 2023-06-16 DIAGNOSIS — H2512 Age-related nuclear cataract, left eye: Secondary | ICD-10-CM | POA: Insufficient documentation

## 2023-06-16 DIAGNOSIS — K219 Gastro-esophageal reflux disease without esophagitis: Secondary | ICD-10-CM | POA: Insufficient documentation

## 2023-06-16 DIAGNOSIS — I10 Essential (primary) hypertension: Secondary | ICD-10-CM | POA: Diagnosis not present

## 2023-06-16 DIAGNOSIS — E785 Hyperlipidemia, unspecified: Secondary | ICD-10-CM | POA: Insufficient documentation

## 2023-06-16 DIAGNOSIS — G4730 Sleep apnea, unspecified: Secondary | ICD-10-CM | POA: Diagnosis not present

## 2023-06-16 HISTORY — PX: CATARACT EXTRACTION W/PHACO: SHX586

## 2023-06-16 SURGERY — PHACOEMULSIFICATION, CATARACT, WITH IOL INSERTION
Anesthesia: Monitor Anesthesia Care | Site: Eye | Laterality: Left

## 2023-06-16 MED ORDER — BRIMONIDINE TARTRATE-TIMOLOL 0.2-0.5 % OP SOLN
OPHTHALMIC | Status: DC | PRN
  Administered 2023-06-16: 1 [drp] via OPHTHALMIC

## 2023-06-16 MED ORDER — MOXIFLOXACIN HCL 0.5 % OP SOLN
OPHTHALMIC | Status: DC | PRN
  Administered 2023-06-16: .2 mL via OPHTHALMIC

## 2023-06-16 MED ORDER — SIGHTPATH DOSE#1 BSS IO SOLN
INTRAOCULAR | Status: DC | PRN
  Administered 2023-06-16: 15 mL

## 2023-06-16 MED ORDER — LACTATED RINGERS IV SOLN: INTRAVENOUS | Status: DC

## 2023-06-16 MED ORDER — MIDAZOLAM HCL 2 MG/2ML IJ SOLN
INTRAMUSCULAR | Status: DC | PRN
  Administered 2023-06-16: 2 mg via INTRAVENOUS

## 2023-06-16 MED ORDER — TETRACAINE HCL 0.5 % OP SOLN
1.0000 [drp] | OPHTHALMIC | Status: DC | PRN
  Administered 2023-06-16 (×3): 1 [drp] via OPHTHALMIC

## 2023-06-16 MED ORDER — SIGHTPATH DOSE#1 NA CHONDROIT SULF-NA HYALURON 40-17 MG/ML IO SOLN
INTRAOCULAR | Status: DC | PRN
  Administered 2023-06-16: 1 mL via INTRAOCULAR

## 2023-06-16 MED ORDER — SIGHTPATH DOSE#1 BSS IO SOLN
INTRAOCULAR | Status: DC | PRN
  Administered 2023-06-16: 1 mL

## 2023-06-16 MED ORDER — SIGHTPATH DOSE#1 BSS IO SOLN
INTRAOCULAR | Status: DC | PRN
  Administered 2023-06-16: 51 mL via OPHTHALMIC

## 2023-06-16 MED ORDER — FENTANYL CITRATE (PF) 100 MCG/2ML IJ SOLN
INTRAMUSCULAR | Status: DC | PRN
  Administered 2023-06-16: 50 ug via INTRAVENOUS

## 2023-06-16 MED ORDER — ARMC OPHTHALMIC DILATING DROPS
1.0000 | OPHTHALMIC | Status: AC
  Administered 2023-06-16 (×3): 1 via OPHTHALMIC

## 2023-06-16 SURGICAL SUPPLY — 16 items
ANGLE REVERSE CUT SHRT 25GA (CUTTER) ×1
CANNULA ANT/CHMB 27G (MISCELLANEOUS) IMPLANT
CANNULA ANT/CHMB 27GA (MISCELLANEOUS)
CATARACT SUITE SIGHTPATH (MISCELLANEOUS) ×1
CYSTOTOME ANGL RVRS SHRT 25G (CUTTER) ×1 IMPLANT
FEE CATARACT SUITE SIGHTPATH (MISCELLANEOUS) ×1 IMPLANT
GLOVE BIOGEL PI IND STRL 8 (GLOVE) ×1 IMPLANT
GLOVE SURG LX STRL 8.0 MICRO (GLOVE) ×1 IMPLANT
LENS IOL TECNIS EYHANCE 22.0 (Intraocular Lens) IMPLANT
NDL FILTER BLUNT 18X1 1/2 (NEEDLE) ×1 IMPLANT
NEEDLE FILTER BLUNT 18X1 1/2 (NEEDLE) ×1
PACK VIT ANT 23G (MISCELLANEOUS) IMPLANT
RING MALYGIN (MISCELLANEOUS) IMPLANT
SUT ETHILON 10-0 CS-B-6CS-B-6 (SUTURE)
SUTURE EHLN 10-0 CS-B-6CS-B-6 (SUTURE) IMPLANT
SYR 3ML LL SCALE MARK (SYRINGE) ×1 IMPLANT

## 2023-06-16 NOTE — Anesthesia Postprocedure Evaluation (Signed)
Anesthesia Post Note  Patient: Adam Villa  Procedure(s) Performed: CATARACT EXTRACTION PHACO AND INTRAOCULAR LENS PLACEMENT (IOC) LEFT (Left: Eye)  Patient location during evaluation: PACU Anesthesia Type: MAC Level of consciousness: awake and alert Pain management: pain level controlled Vital Signs Assessment: post-procedure vital signs reviewed and stable Respiratory status: spontaneous breathing, nonlabored ventilation, respiratory function stable and patient connected to nasal cannula oxygen Cardiovascular status: stable and blood pressure returned to baseline Postop Assessment: no apparent nausea or vomiting Anesthetic complications: no   No notable events documented.   Last Vitals:  Vitals:   06/16/23 0953 06/16/23 0959  BP: 117/64 136/68  Pulse: (!) 50 (!) 51  Resp: 15 15  Temp: 36.4 C 36.4 C  SpO2: 98% 97%    Last Pain:  Vitals:   06/16/23 0959  TempSrc:   PainSc: 0-No pain                 Marisue Humble

## 2023-06-16 NOTE — H&P (Signed)
[facility]   Primary Care Physician:  Mick Sell, MD Ophthalmologist: Dr. Druscilla Brownie  Pre-Procedure History & Physical: HPI:  Adam Villa is a 72 y.o. male here for cataract surgery.   Past Medical History:  Diagnosis Date   Arthritis    Elevated lipids    GERD (gastroesophageal reflux disease)    Hyperlipidemia    Hypertension    Macular degeneration    Mini stroke    POSSIBLE-PT WAS UNSURE IF IT WAS A MINI-STROKE OR AN INFECTION   Sleep apnea    uses cpap   Stroke [facility])    mini stroke 2002   Torn rotator cuff    left    Past Surgical History:  Procedure Laterality Date   BACK SURGERY  1970   lower   CATARACT EXTRACTION W/PHACO Right 05/12/2023   Procedure: CATARACT EXTRACTION PHACO AND INTRAOCULAR LENS PLACEMENT (IOC) RIGHT 13.49 01:03.4;  Surgeon: Galen Manila, MD;  Location: [facility];  Service: Ophthalmology;  Laterality: Right;   COLONOSCOPY     cyst from back     JOINT REPLACEMENT Left 07/2016   KNEE ARTHROSCOPY Left    KNEE ARTHROSCOPY Right 10/23/2015   Procedure: ARTHROSCOPY KNEE, PARTIAL MEDIAL MENISECTOMY, PARTIAL SYNOVECTOMY;  Surgeon: Kennedy Bucker, MD;  Location: [facility];  Service: Orthopedics;  Laterality: Right;   MOHS SURGERY     SHOULDER ARTHROSCOPY WITH OPEN ROTATOR CUFF REPAIR Left 08/30/2015   Procedure: SHOULDER ARTHROSCOPY WITH OPEN ROTATOR CUFF REPAIR and decompression, debridement , biceps tenodesis;  Surgeon: Christena Flake, MD;  Location: [facility] OR[facility];  Service: Orthopedics;  Laterality: Left;   TONSILLECTOMY     TOTAL KNEE ARTHROPLASTY Left 08/12/2016   Procedure: TOTAL KNEE ARTHROPLASTY;  Surgeon: Kennedy Bucker, MD;  Location: [facility];  Service: Orthopedics;  Laterality: Left;   TOTAL KNEE ARTHROPLASTY Right 10/07/2016   Procedure: TOTAL KNEE ARTHROPLASTY;  Surgeon: Kennedy Bucker, MD;  Location: [facility];  Service: Orthopedics;  Laterality: Right;    Prior to Admission medications   Medication Sig Start Date End  Date Taking? Authorizing Provider  amLODipine (NORVASC) 10 MG tablet Take 10 mg by mouth every other day.   Yes [provider]  ASPIRIN 81 PO Take 81 mg by mouth daily.   Yes [provider]  diphenhydramine-acetaminophen (TYLENOL PM) 25-500 MG TABS tablet Take 2 tablets by mouth at bedtime as needed.   Yes [provider]  Misc Natural Products (TURMERIC CURCUMIN) CAPS Take by mouth.   Yes [provider]  Multiple Vitamins-Minerals (PRESERVISION AREDS 2 PO) Take by mouth.   Yes [provider]  Omega-3 Fatty Acids (FISH OIL) 1200 MG CAPS Take 2 capsules by mouth daily.   Yes [provider]    Allergies as of 06/09/2023 - Review Complete 06/09/2023  Allergen Reaction Noted   Penicillins Rash 11/12/2009    Family History  Problem Relation Age of Onset   Colon cancer Neg Hx    Esophageal cancer Neg Hx    Rectal cancer Neg Hx    Stomach cancer Neg Hx    Colon polyps Neg Hx     Social History   Socioeconomic History   Marital status: Married    Spouse name: Not on file   Number of children: Not on file   Years of education: Not on file   Highest education level: Not on file  Occupational History   Not on file  Tobacco Use   Smoking status: Former  Current packs/day: 0.00    Types: Cigarettes    Quit date: 08/21/2000    Years since quitting: 22.8   Smokeless tobacco: Never  Vaping Use   Vaping status: Never Used  Substance and Sexual Activity   Alcohol use: Not Currently   Drug use: No   Sexual activity: Yes  Other Topics Concern   Not on file  Social History Narrative   Not on file   Social Determinants of Health   Financial Resource Strain: Low Risk  (05/21/2023)   Received from [facility]   Overall Financial Resource Strain (CARDIA)    Difficulty of Paying Living Expenses: Not hard at all  Food Insecurity: No Food Insecurity (05/21/2023)   Received from [facility]    Hunger Vital Sign    Worried About Running Out of Food in the Last Year: Never true    Ran Out of Food in the Last Year: Never true  Transportation Needs: No Transportation Needs (05/21/2023)   Received from [facility] - Transportation    In the past 12 months, has lack of transportation kept you from medical appointments or from getting medications?: No    Lack of Transportation (Non-Medical): No  Physical Activity: Not on file  Stress: Not on file  Social Connections: Not on file  Intimate Partner Violence: Not on file    Review of Systems: See HPI, otherwise negative ROS  Physical Exam: BP (!) 151/68   Pulse (!) 55   Temp 98.1 F (36.7 C) (Temporal)   Resp 12   Ht 5\' 10"  (1.778 m)   Wt 86.2 kg   SpO2 99%   BMI 27.26 kg/m  General:   Alert, cooperative in NAD Head:  Normocephalic and atraumatic. Respiratory:  Normal work of breathing. Cardiovascular:  RRR  Impression/Plan: Adam Villa is here for cataract surgery.  Risks, benefits, limitations, and alternatives regarding cataract surgery have been reviewed with the patient.  Questions have been answered.  All parties agreeable.   Galen Manila, MD  06/16/2023, 9:32 AM

## 2023-06-16 NOTE — Transfer of Care (Signed)
Immediate Anesthesia Transfer of Care Note  Patient: Adam Villa  Procedure(s) Performed: CATARACT EXTRACTION PHACO AND INTRAOCULAR LENS PLACEMENT (IOC) LEFT (Left: Eye)  Patient Location: PACU  Anesthesia Type: MAC  Level of Consciousness: awake, alert  and patient cooperative  Airway and Oxygen Therapy: Patient Spontanous Breathing and Patient connected to supplemental oxygen  Post-op Assessment: Post-op Vital signs reviewed, Patient's Cardiovascular Status Stable, Respiratory Function Stable, Patent Airway and No signs of Nausea or vomiting  Post-op Vital Signs: Reviewed and stable  Complications: No notable events documented.

## 2023-06-16 NOTE — Op Note (Signed)
PREOPERATIVE DIAGNOSIS:  Nuclear sclerotic cataract of the left eye.   POSTOPERATIVE DIAGNOSIS:  Nuclear sclerotic cataract of the left eye.   OPERATIVE PROCEDURE:ORPROCALL@   SURGEON:  Galen Manila, MD.   ANESTHESIA:  Anesthesiologist: Marisue Humble, MD CRNA: Barbette Hair, CRNA  1.      Managed anesthesia care. 2.     0.4ml of Shugarcaine was instilled following the paracentesis   COMPLICATIONS:  None.   TECHNIQUE:   Stop and chop   DESCRIPTION OF PROCEDURE:  The patient was examined and consented in the preoperative holding area where the aforementioned topical anesthesia was applied to the left eye and then brought back to the Operating Room where the left eye was prepped and draped in the usual sterile ophthalmic fashion and a lid speculum was placed. A paracentesis was created with the side port blade and the anterior chamber was filled with viscoelastic. A near clear corneal incision was performed with the steel keratome. A continuous curvilinear capsulorrhexis was performed with a cystotome followed by the capsulorrhexis forceps. Hydrodissection and hydrodelineation were carried out with BSS on a blunt cannula. The lens was removed in a stop and chop  technique and the remaining cortical material was removed with the irrigation-aspiration handpiece. The capsular bag was inflated with viscoelastic and the Technis [id number] lens was placed in the capsular bag without complication. The remaining viscoelastic was removed from the eye with the irrigation-aspiration handpiece. The wounds were hydrated. The anterior chamber was flushed with BSS and the eye was inflated to physiologic pressure. 0.12ml Vigamox was placed in the anterior chamber. The wounds were found to be water tight. The eye was dressed with Combigan. The patient was given protective glasses to wear throughout the day and a shield with which to sleep tonight. The patient was also given drops with which to begin a drop regimen  today and will follow-up with me in one day. Implant Name Type Inv. Item Serial No. Manufacturer Lot No. LRB No. Used Action  LENS IOL TECNIS EYHANCE 22.0 - [id number] Intraocular Lens LENS IOL TECNIS EYHANCE 22.0 [id number] [employer]  Left 1 Implanted    Procedure(s) with comments: CATARACT EXTRACTION PHACO AND INTRAOCULAR LENS PLACEMENT (IOC) LEFT (Left) - 4.48 0:31.2  Electronically signed: Galen Manila 06/16/2023 9:53 AM

## 2023-06-17 ENCOUNTER — Encounter: Payer: Self-pay | Admitting: Ophthalmology

## 2024-01-01 ENCOUNTER — Ambulatory Visit
Admission: EM | Admit: 2024-01-01 | Discharge: 2024-01-01 | Disposition: A | Discharge status: Home / Self Care | Attending: Emergency Medicine | Admitting: Emergency Medicine

## 2024-01-01 ENCOUNTER — Ambulatory Visit (INDEPENDENT_AMBULATORY_CARE_PROVIDER_SITE_OTHER)

## 2024-01-01 DIAGNOSIS — S60552A Superficial foreign body of left hand, initial encounter: Secondary | ICD-10-CM

## 2024-01-01 DIAGNOSIS — L03114 Cellulitis of left upper limb: Secondary | ICD-10-CM | POA: Diagnosis not present

## 2024-01-01 DIAGNOSIS — L089 Local infection of the skin and subcutaneous tissue, unspecified: Secondary | ICD-10-CM

## 2024-01-01 MED ORDER — DOXYCYCLINE HYCLATE 100 MG PO CAPS: 100.0000 mg | ORAL_CAPSULE | Freq: Two times a day (BID) | ORAL | 0 refills | Status: AC

## 2024-01-01 NOTE — ED Provider Notes (Signed)
 Adam Villa    CSN: [id number] Arrival date & time: 01/01/24  7829      History   Chief Complaint Chief Complaint  Patient presents with   Foreign Body in Skin    HPI Adam Villa is a 73 y.o. male.  Patient presents redness, swelling, discomfort of left hand x 1 week.  He accidentally got a piece of plywood embedded in his hand. The "splinter" is approximately 1.5 -2 inches in length.  Patient states initially he was able to feel the splinter and attempted to remove it without success.  He states he is no longer able to feel it due to how deep it is and the mild swelling.  No fever or wound drainage.  He reports last tetanus approximately 2 years ago.  The history is provided by the patient and medical records.    Past Medical History:  Diagnosis Date   Arthritis    Elevated lipids    GERD (gastroesophageal reflux disease)    Hyperlipidemia    Hypertension    Macular degeneration    Mini stroke    POSSIBLE-PT WAS UNSURE IF IT WAS A MINI-STROKE OR AN INFECTION   Sleep apnea    uses cpap   Stroke [facility])    mini stroke 2002   Torn rotator cuff    left    Patient Active Problem List   Diagnosis Date Noted   Primary osteoarthritis of right knee 10/07/2016   Primary localized osteoarthritis of left knee 08/12/2016    Past Surgical History:  Procedure Laterality Date   BACK SURGERY  1970   lower   CATARACT EXTRACTION W/PHACO Right 05/12/2023   Procedure: CATARACT EXTRACTION PHACO AND INTRAOCULAR LENS PLACEMENT (IOC) RIGHT 13.49 01:03.4;  Surgeon: Galen Manila, MD;  Location: [facility];  Service: Ophthalmology;  Laterality: Right;   CATARACT EXTRACTION W/PHACO Left 06/16/2023   Procedure: CATARACT EXTRACTION PHACO AND INTRAOCULAR LENS PLACEMENT (IOC) LEFT;  Surgeon: Galen Manila, MD;  Location: [facility];  Service: Ophthalmology;  Laterality: Left;  4.48 0:31.2   COLONOSCOPY     cyst from back     JOINT REPLACEMENT Left 07/2016    KNEE ARTHROSCOPY Left    KNEE ARTHROSCOPY Right 10/23/2015   Procedure: ARTHROSCOPY KNEE, PARTIAL MEDIAL MENISECTOMY, PARTIAL SYNOVECTOMY;  Surgeon: Kennedy Bucker, MD;  Location: [facility];  Service: Orthopedics;  Laterality: Right;   MOHS SURGERY     SHOULDER ARTHROSCOPY WITH OPEN ROTATOR CUFF REPAIR Left 08/30/2015   Procedure: SHOULDER ARTHROSCOPY WITH OPEN ROTATOR CUFF REPAIR and decompression, debridement , biceps tenodesis;  Surgeon: Christena Flake, MD;  Location: [facility];  Service: Orthopedics;  Laterality: Left;   TONSILLECTOMY     TOTAL KNEE ARTHROPLASTY Left 08/12/2016   Procedure: TOTAL KNEE ARTHROPLASTY;  Surgeon: Kennedy Bucker, MD;  Location: [facility];  Service: Orthopedics;  Laterality: Left;   TOTAL KNEE ARTHROPLASTY Right 10/07/2016   Procedure: TOTAL KNEE ARTHROPLASTY;  Surgeon: Kennedy Bucker, MD;  Location: [facility];  Service: Orthopedics;  Laterality: Right;       Home Medications    Prior to Admission medications   Medication Sig Start Date End Date Taking? Authorizing Provider  doxycycline (VIBRAMYCIN) 100 MG capsule Take 1 capsule (100 mg total) by mouth 2 (two) times daily for 7 days. 01/01/24 01/08/24 Yes Mickie Bail, NP  amLODipine (NORVASC) 10 MG tablet Take 10 mg by mouth every other day.    [provider]  ASPIRIN 81 PO Take 81  mg by mouth daily.    [provider]  diphenhydramine-acetaminophen (TYLENOL PM) 25-500 MG TABS tablet Take 2 tablets by mouth at bedtime as needed.    [provider]  Misc Natural Products (TURMERIC CURCUMIN) CAPS Take by mouth.    [provider]  Multiple Vitamins-Minerals (PRESERVISION AREDS 2 PO) Take by mouth.    [provider]  Omega-3 Fatty Acids (FISH OIL) 1200 MG CAPS Take 2 capsules by mouth daily.    [provider]    Family History Family History  Problem Relation Age of Onset   Colon cancer Neg Hx    Esophageal cancer Neg Hx    Rectal cancer Neg Hx    Stomach cancer  Neg Hx    Colon polyps Neg Hx     Social History Social History   Tobacco Use   Smoking status: Former    Current packs/day: 0.00    Types: Cigarettes    Quit date: 08/21/2000    Years since quitting: 23.3   Smokeless tobacco: Never  Vaping Use   Vaping status: Never Used  Substance Use Topics   Alcohol use: Not Currently   Drug use: No     Allergies   Penicillins   Review of Systems Review of Systems  Constitutional:  Negative for chills and fever.  Musculoskeletal:  Positive for arthralgias and joint swelling.  Skin:  Positive for color change and wound.  Neurological:  Negative for weakness and numbness.     Physical Exam Triage Vital Signs ED Triage Vitals  Encounter Vitals Group     BP 01/01/24 0824 139/77     Systolic BP Percentile --      Diastolic BP Percentile --      Pulse Rate 01/01/24 0824 66     Resp 01/01/24 0824 18     Temp 01/01/24 0824 97.8 F (36.6 C)     Temp src --      SpO2 01/01/24 0824 96 %     Weight --      Height --      Head Circumference --      Peak Flow --      Pain Score 01/01/24 0829 2     Pain Loc --      Pain Education --      Exclude from Growth Chart --    No data found.  Updated Vital Signs BP 139/77   Pulse 66   Temp 97.8 F (36.6 C)   Resp 18   SpO2 96%   Visual Acuity Right Eye Distance:   Left Eye Distance:   Bilateral Distance:    Right Eye Near:   Left Eye Near:    Bilateral Near:     Physical Exam Constitutional:      General: He is not in acute distress. HENT:     Mouth/Throat:     Mouth: Mucous membranes are moist.  Cardiovascular:     Rate and Rhythm: Normal rate and regular rhythm.  Pulmonary:     Effort: Pulmonary effort is normal. No respiratory distress.  Musculoskeletal:        General: Swelling and tenderness present. No deformity. Normal range of motion.  Skin:    General: Skin is warm and dry.     Capillary Refill: Capillary refill takes less than 2 seconds.     Findings:  Erythema and lesion present.     Comments: Wound and erythema of left hand.  See picture.  Neurological:     General: No focal deficit present.     Mental Status: He is alert.     Sensory: No sensory deficit.     Motor: No weakness.      UC Treatments / Results  Labs (all labs ordered are listed, but only abnormal results are displayed) Labs Reviewed - No data to display  EKG   Radiology No results found.  Procedures Procedures (including critical care time)  Medications Ordered in UC Medications - No data to display  Initial Impression / Assessment and Plan / UC Course  I have reviewed the triage vital signs and the nursing notes.  Pertinent labs & imaging results that were available during my care of the patient were reviewed by me and considered in my medical decision making (see chart for details).    Infected foreign body and cellulitis of left hand.  Patient reports the piece of wood embedded in his left hand is approximately 1.5 to 2 inches long.  Attempts to remove it have been unsuccessful.  Treating today with instructions given to patient to follow-up with a general surgeon.  Contact information for on-call surgeon provided.  Strict ED precautions given.  Education provided on foreign body in hand and cellulitis.  Patient agrees to plan of care.  Final Clinical Impressions(s) / UC Diagnoses   Final diagnoses:  Infected foreign body in left hand, initial encounter  Cellulitis of hand, left     Discharge Instructions      Take the doxycycline as directed.  Follow-up with a general surgeon such as the one listed below.     ED Prescriptions     Medication Sig Dispense Auth. Provider   doxycycline (VIBRAMYCIN) 100 MG capsule Take 1 capsule (100 mg total) by mouth 2 (two) times daily for 7 days. 14 capsule Mickie Bail, NP      PDMP not reviewed this encounter.   Mickie Bail, NP 01/01/24 [phone]

## 2024-01-01 NOTE — ED Triage Notes (Signed)
 Patient to Urgent Care with complaints of a splinter in his left thumb from a piece of plywood.   Incident occurred Friday. Reports he has been trying to remove it but has been unsuccessful. No drainage/ redness to wound.  Believes last TDAP was 1-2 years ago.

## 2024-01-01 NOTE — Discharge Instructions (Signed)
Take the doxycycline as directed.  Follow up with a general surgeon such as the one listed below.   

## 2024-02-20 ENCOUNTER — Ambulatory Visit
Admission: EM | Admit: 2024-02-20 | Discharge: 2024-02-20 | Disposition: A | Discharge status: Home / Self Care | Attending: Emergency Medicine | Admitting: Emergency Medicine

## 2024-02-20 DIAGNOSIS — H5712 Ocular pain, left eye: Secondary | ICD-10-CM

## 2024-02-20 MED ORDER — KETOROLAC TROMETHAMINE 0.5 % OP SOLN: 1.0000 [drp] | Freq: Four times a day (QID) | OPHTHALMIC | 0 refills | Status: AC

## 2024-02-20 MED ORDER — ERYTHROMYCIN 5 MG/GM OP OINT: TOPICAL_OINTMENT | OPHTHALMIC | 0 refills | Status: AC

## 2024-02-20 NOTE — ED Provider Notes (Signed)
 Adam Villa    CSN: [id number] Arrival date & time: 02/20/24  1113      History   Chief Complaint Chief Complaint  Patient presents with   Eye Problem    HPI Adam Villa is a 72 y.o. male.   Patient presents for evaluation of pain and a sensation to the foreign body to the left eye beginning overnight around 12 AM.  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  Has not attempted treatment.  Denies purulent drainage, vision changes, light sensitivity, injury or trauma or pruritus.   Past Medical History:  Diagnosis Date   Arthritis    Elevated lipids    GERD (gastroesophageal reflux disease)    Hyperlipidemia    Hypertension    Macular degeneration    Mini stroke    POSSIBLE-PT WAS UNSURE IF IT WAS A MINI-STROKE OR AN INFECTION   Sleep apnea    uses cpap   Stroke [facility])    mini stroke 2002   Torn rotator cuff    left    Patient Active Problem List   Diagnosis Date Noted   Primary osteoarthritis of right knee 10/07/2016   Primary localized osteoarthritis of left knee 08/12/2016    Past Surgical History:  Procedure Laterality Date   BACK SURGERY  1970   lower   CATARACT EXTRACTION W/PHACO Right 05/12/2023   Procedure: CATARACT EXTRACTION PHACO AND INTRAOCULAR LENS PLACEMENT (IOC) RIGHT 13.49 01:03.4;  Surgeon: Clair Crews, MD;  Location: [facility];  Service: Ophthalmology;  Laterality: Right;   CATARACT EXTRACTION W/PHACO Left 06/16/2023   Procedure: CATARACT EXTRACTION PHACO AND INTRAOCULAR LENS PLACEMENT (IOC) LEFT;  Surgeon: Clair Crews, MD;  Location: [facility];  Service: Ophthalmology;  Laterality: Left;  4.48 0:31.2   COLONOSCOPY     cyst from back     JOINT REPLACEMENT Left 07/2016   KNEE ARTHROSCOPY Left    KNEE ARTHROSCOPY Right 10/23/2015   Procedure: ARTHROSCOPY KNEE, PARTIAL MEDIAL MENISECTOMY, PARTIAL SYNOVECTOMY;  Surgeon: Molli Angelucci, MD;  Location: [facility];  Service: Orthopedics;   Laterality: Right;   MOHS SURGERY     SHOULDER ARTHROSCOPY WITH OPEN ROTATOR CUFF REPAIR Left 08/30/2015   Procedure: SHOULDER ARTHROSCOPY WITH OPEN ROTATOR CUFF REPAIR and decompression, debridement , biceps tenodesis;  Surgeon: Elner Hahn, MD;  Location: [facility];  Service: Orthopedics;  Laterality: Left;   TONSILLECTOMY     TOTAL KNEE ARTHROPLASTY Left 08/12/2016   Procedure: TOTAL KNEE ARTHROPLASTY;  Surgeon: Molli Angelucci, MD;  Location: [facility];  Service: Orthopedics;  Laterality: Left;   TOTAL KNEE ARTHROPLASTY Right 10/07/2016   Procedure: TOTAL KNEE ARTHROPLASTY;  Surgeon: Molli Angelucci, MD;  Location: [facility];  Service: Orthopedics;  Laterality: Right;       Home Medications    Prior to Admission medications   Medication Sig Start Date End Date Taking? Authorizing Provider  amLODipine  (NORVASC ) 10 MG tablet Take 10 mg by mouth every other day.   Yes [provider]  ASPIRIN  81 PO Take 81 mg by mouth daily.   Yes [provider]  diphenhydramine -acetaminophen  (TYLENOL  PM) 25-500 MG TABS tablet Take 2 tablets by mouth at bedtime as needed.   Yes [provider]  erythromycin ophthalmic ointment Place a 1/2 inch ribbon of ointment into the lower eyelid twice a day for 7 days 02/20/24  Yes Brodie Correll R, NP  ketorolac  (ACULAR ) 0.5 % ophthalmic solution Place 1 drop into both  eyes every 6 (six) hours. 02/20/24  Yes Chinara Hertzberg, Maybelle Spatz, NP  Misc Natural Products (TURMERIC CURCUMIN) CAPS Take by mouth.   Yes [provider]  Multiple Vitamins-Minerals (PRESERVISION AREDS 2 PO) Take by mouth.   Yes [provider]  Omega-3 Fatty Acids (FISH OIL) 1200 MG CAPS Take 2 capsules by mouth daily.   Yes [provider]    Family History Family History  Problem Relation Age of Onset   Colon cancer Neg Hx    Esophageal cancer Neg Hx    Rectal cancer Neg Hx    Stomach cancer Neg Hx    Colon polyps Neg Hx     Social History Social  History   Tobacco Use   Smoking status: Former    Current packs/day: 0.00    Types: Cigarettes    Quit date: 08/21/2000    Years since quitting: 23.5   Smokeless tobacco: Never  Vaping Use   Vaping status: Never Used  Substance Use Topics   Alcohol use: Not Currently   Drug use: No     Allergies   Penicillins   Review of Systems Review of Systems   Physical Exam Triage Vital Signs ED Triage Vitals [02/20/24 1208]  Encounter Vitals Group     BP (!) 162/68     Systolic BP Percentile      Diastolic BP Percentile      Pulse Rate (!) 59     Resp      Temp 98.4 F (36.9 C)     Temp Source Oral     SpO2 98 %     Weight 195 lb (88.5 kg)     Height 5\' 10"  (1.778 m)     Head Circumference      Peak Flow      Pain Score 7     Pain Loc      Pain Education      Exclude from Growth Chart    No data found.  Updated Vital Signs BP (!) 162/68 (BP Location: Left Arm)   Pulse (!) 59   Temp 98.4 F (36.9 C) (Oral)   Ht 5\' 10"  (1.778 m)   Wt 195 lb (88.5 kg)   SpO2 98%   BMI 27.98 kg/m   Visual Acuity Right Eye Distance:   Left Eye Distance:   Bilateral Distance:    Right Eye Near:   Left Eye Near:    Bilateral Near:     Physical Exam Constitutional:      Appearance: Normal appearance.  Eyes:     Comments: Mild Erythema present to the sclera, vision grossly intact, extraocular movements intact, no drainage noted on exam, no stye noted, no corneal abrasion  Neurological:     Mental Status: He is alert.      UC Treatments / Results  Labs (all labs ordered are listed, but only abnormal results are displayed) Labs Reviewed - No data to display  EKG   Radiology No results found.  Procedures Procedures (including critical care time)  Medications Ordered in UC Medications - No data to display  Initial Impression / Assessment and Plan / UC Course  I have reviewed the triage vital signs and the nursing notes.  Pertinent labs & imaging results that  were available during my care of the patient were reviewed by me and considered in my medical decision making (see chart for details).  Left eye pain  Wash completed with approximately 30 mL of eyewash solution, fluorescein  stain completed, negative, prophylactically providing bacterial coverage with the recent mycin to ensure eye does not become infected, prescribed ketorolac  eyedrops, has appointment scheduled with his ophthalmologist on Tuesday and at that time will have rechecked   Final Clinical Impressions(s) / UC Diagnoses   Final diagnoses:  Left eye pain     Discharge Instructions      Today you evaluate for your eye pain, attempted eyewash and completed fluorescein stain to check for scratch in eye which is not present  Place erythromycin ointment in the eye twice daily for 7 days provide coverage for bacteria to ensure outside does not become infected  May use ketorolac  eyedrops every 6 hours as needed for pain, helps with inflammation  Avoid touching or rubbing the eye as this can cause further irritation  please follow-up with your eye doctor on Tuesday for further management   ED Prescriptions     Medication Sig Dispense Auth. Provider   erythromycin ophthalmic ointment Place a 1/2 inch ribbon of ointment into the lower eyelid twice a day for 7 days 3.5 g Peng Thorstenson R, NP   ketorolac  (ACULAR ) 0.5 % ophthalmic solution Place 1 drop into both eyes every 6 (six) hours. 5 mL Reena Canning, NP      PDMP not reviewed this encounter.   Reena Canning, [place] 02/20/24 [phone]

## 2024-02-20 NOTE — ED Triage Notes (Signed)
 Pt c/o left eye problem x1day  Pt states that it feels like something is in his eye  Pt was asleep and woke up at 12:30 with something in his eye and his eye has been irritated since then.  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

## 2024-02-20 NOTE — Discharge Instructions (Addendum)
 Today you evaluate for your eye pain, attempted eyewash and completed fluorescein stain to check for scratch in eye which is not present  Place erythromycin ointment in the eye twice daily for 7 days provide coverage for bacteria to ensure outside does not become infected  May use ketorolac  eyedrops every 6 hours as needed for pain, helps with inflammation  Avoid touching or rubbing the eye as this can cause further irritation  please follow-up with your eye doctor on Tuesday for further management

## 2024-11-17 ENCOUNTER — Ambulatory Visit

## 2024-11-17 DIAGNOSIS — K635 Polyp of colon: Secondary | ICD-10-CM | POA: Diagnosis not present

## 2024-11-17 DIAGNOSIS — K641 Second degree hemorrhoids: Secondary | ICD-10-CM | POA: Diagnosis not present

## 2024-11-17 DIAGNOSIS — Z09 Encounter for follow-up examination after completed treatment for conditions other than malignant neoplasm: Secondary | ICD-10-CM | POA: Diagnosis present

## 2024-11-17 DIAGNOSIS — D120 Benign neoplasm of cecum: Secondary | ICD-10-CM | POA: Diagnosis not present

## 2024-11-17 DIAGNOSIS — Z860100 Personal history of colon polyps, unspecified: Secondary | ICD-10-CM | POA: Diagnosis not present
